# Patient Record
Sex: Male | Born: 1935 | Race: White | Hispanic: No | Smoking: Former smoker
Health system: Southern US, Community
[De-identification: ages and names within clinical notes are randomized; demographics above are authoritative.]

## PROBLEM LIST (undated history)

## (undated) DIAGNOSIS — R011 Cardiac murmur, unspecified: Secondary | ICD-10-CM

## (undated) DIAGNOSIS — I4819 Other persistent atrial fibrillation: Secondary | ICD-10-CM

## (undated) DIAGNOSIS — Z903 Acquired absence of stomach [part of]: Secondary | ICD-10-CM

## (undated) DIAGNOSIS — R0602 Shortness of breath: Secondary | ICD-10-CM

## (undated) DIAGNOSIS — K279 Peptic ulcer, site unspecified, unspecified as acute or chronic, without hemorrhage or perforation: Secondary | ICD-10-CM

## (undated) DIAGNOSIS — I2510 Atherosclerotic heart disease of native coronary artery without angina pectoris: Secondary | ICD-10-CM

## (undated) DIAGNOSIS — Z9889 Other specified postprocedural states: Secondary | ICD-10-CM

## (undated) DIAGNOSIS — Z953 Presence of xenogenic heart valve: Secondary | ICD-10-CM

## (undated) DIAGNOSIS — I340 Nonrheumatic mitral (valve) insufficiency: Secondary | ICD-10-CM

## (undated) DIAGNOSIS — Z8679 Personal history of other diseases of the circulatory system: Secondary | ICD-10-CM

## (undated) DIAGNOSIS — I519 Heart disease, unspecified: Secondary | ICD-10-CM

## (undated) DIAGNOSIS — I509 Heart failure, unspecified: Secondary | ICD-10-CM

## (undated) DIAGNOSIS — I10 Essential (primary) hypertension: Secondary | ICD-10-CM

## (undated) HISTORY — PX: PARTIAL GASTRECTOMY: SHX2172

## (undated) HISTORY — DX: Heart disease, unspecified: I51.9

## (undated) HISTORY — PX: CARDIAC CATHETERIZATION: SHX172

## (undated) HISTORY — DX: Atherosclerotic heart disease of native coronary artery without angina pectoris: I25.10

---

## 2007-08-05 ENCOUNTER — Encounter: Payer: Self-pay | Admitting: Cardiology

## 2007-08-06 ENCOUNTER — Ambulatory Visit: Payer: Self-pay | Admitting: Internal Medicine

## 2007-08-06 ENCOUNTER — Ambulatory Visit: Payer: Self-pay | Admitting: Cardiology

## 2007-08-06 ENCOUNTER — Inpatient Hospital Stay (HOSPITAL_COMMUNITY): Admission: AD | Admit: 2007-08-06 | Discharge: 2007-08-09 | Payer: Self-pay | Admitting: Cardiology

## 2007-08-06 ENCOUNTER — Encounter: Payer: Self-pay | Admitting: Internal Medicine

## 2007-08-07 ENCOUNTER — Encounter: Payer: Self-pay | Admitting: Cardiology

## 2007-08-07 ENCOUNTER — Ambulatory Visit: Payer: Self-pay | Admitting: Thoracic Surgery (Cardiothoracic Vascular Surgery)

## 2007-08-08 ENCOUNTER — Encounter: Payer: Self-pay | Admitting: Cardiology

## 2007-08-15 ENCOUNTER — Encounter: Payer: Self-pay | Admitting: Thoracic Surgery (Cardiothoracic Vascular Surgery)

## 2007-08-15 ENCOUNTER — Inpatient Hospital Stay (HOSPITAL_COMMUNITY)
Admission: RE | Admit: 2007-08-15 | Discharge: 2007-08-23 | Payer: Self-pay | Admitting: Thoracic Surgery (Cardiothoracic Vascular Surgery)

## 2007-08-15 DIAGNOSIS — Z951 Presence of aortocoronary bypass graft: Secondary | ICD-10-CM | POA: Diagnosis present

## 2007-08-15 DIAGNOSIS — Z953 Presence of xenogenic heart valve: Secondary | ICD-10-CM

## 2007-08-15 HISTORY — PX: AORTIC VALVE REPLACEMENT: SHX41

## 2007-08-15 HISTORY — PX: CORONARY ARTERY BYPASS GRAFT: SHX141

## 2007-08-15 HISTORY — DX: Presence of xenogenic heart valve: Z95.3

## 2007-08-27 ENCOUNTER — Ambulatory Visit: Payer: Self-pay | Admitting: Cardiology

## 2007-09-03 ENCOUNTER — Ambulatory Visit: Payer: Self-pay | Admitting: Cardiology

## 2007-09-09 ENCOUNTER — Ambulatory Visit: Payer: Self-pay | Admitting: Thoracic Surgery (Cardiothoracic Vascular Surgery)

## 2007-09-09 ENCOUNTER — Encounter
Admission: RE | Admit: 2007-09-09 | Discharge: 2007-09-09 | Payer: Self-pay | Admitting: Thoracic Surgery (Cardiothoracic Vascular Surgery)

## 2007-09-10 ENCOUNTER — Ambulatory Visit: Payer: Self-pay | Admitting: Cardiology

## 2007-09-12 ENCOUNTER — Ambulatory Visit: Payer: Self-pay | Admitting: Cardiology

## 2007-09-20 ENCOUNTER — Ambulatory Visit: Payer: Self-pay | Admitting: Cardiology

## 2007-09-25 ENCOUNTER — Ambulatory Visit: Payer: Self-pay | Admitting: Cardiology

## 2007-10-03 ENCOUNTER — Ambulatory Visit: Payer: Self-pay | Admitting: Cardiology

## 2007-10-08 ENCOUNTER — Ambulatory Visit: Payer: Self-pay | Admitting: Cardiology

## 2007-10-16 ENCOUNTER — Ambulatory Visit: Payer: Self-pay | Admitting: Cardiology

## 2007-10-24 ENCOUNTER — Ambulatory Visit: Payer: Self-pay | Admitting: Cardiology

## 2007-10-30 ENCOUNTER — Ambulatory Visit: Payer: Self-pay | Admitting: Cardiology

## 2007-11-05 ENCOUNTER — Ambulatory Visit: Payer: Self-pay | Admitting: Cardiology

## 2007-11-09 HISTORY — PX: CARDIOVERSION: SHX1299

## 2007-11-11 ENCOUNTER — Encounter: Payer: Self-pay | Admitting: Cardiology

## 2007-11-12 ENCOUNTER — Ambulatory Visit: Payer: Self-pay | Admitting: Cardiology

## 2007-11-20 ENCOUNTER — Ambulatory Visit: Payer: Self-pay | Admitting: Cardiology

## 2007-12-04 ENCOUNTER — Ambulatory Visit: Payer: Self-pay | Admitting: Cardiology

## 2007-12-18 ENCOUNTER — Encounter: Payer: Self-pay | Admitting: Cardiology

## 2007-12-18 ENCOUNTER — Ambulatory Visit: Payer: Self-pay | Admitting: Cardiology

## 2007-12-20 ENCOUNTER — Ambulatory Visit: Payer: Self-pay | Admitting: Thoracic Surgery (Cardiothoracic Vascular Surgery)

## 2007-12-23 ENCOUNTER — Ambulatory Visit: Payer: Self-pay | Admitting: Cardiology

## 2008-01-24 ENCOUNTER — Ambulatory Visit: Payer: Self-pay | Admitting: Cardiology

## 2008-08-11 ENCOUNTER — Ambulatory Visit: Payer: Self-pay | Admitting: Cardiology

## 2008-09-16 IMAGING — CR DG CHEST 2V
2 series · 2 of 2 positions shown · non-contrast
Comparison: Chest x-ray of 08/20/2007

CLINICAL DATA: CABG 3 weeks ago, follow-up

CHEST - 2 VIEW

[view not recorded (1 of 2)]
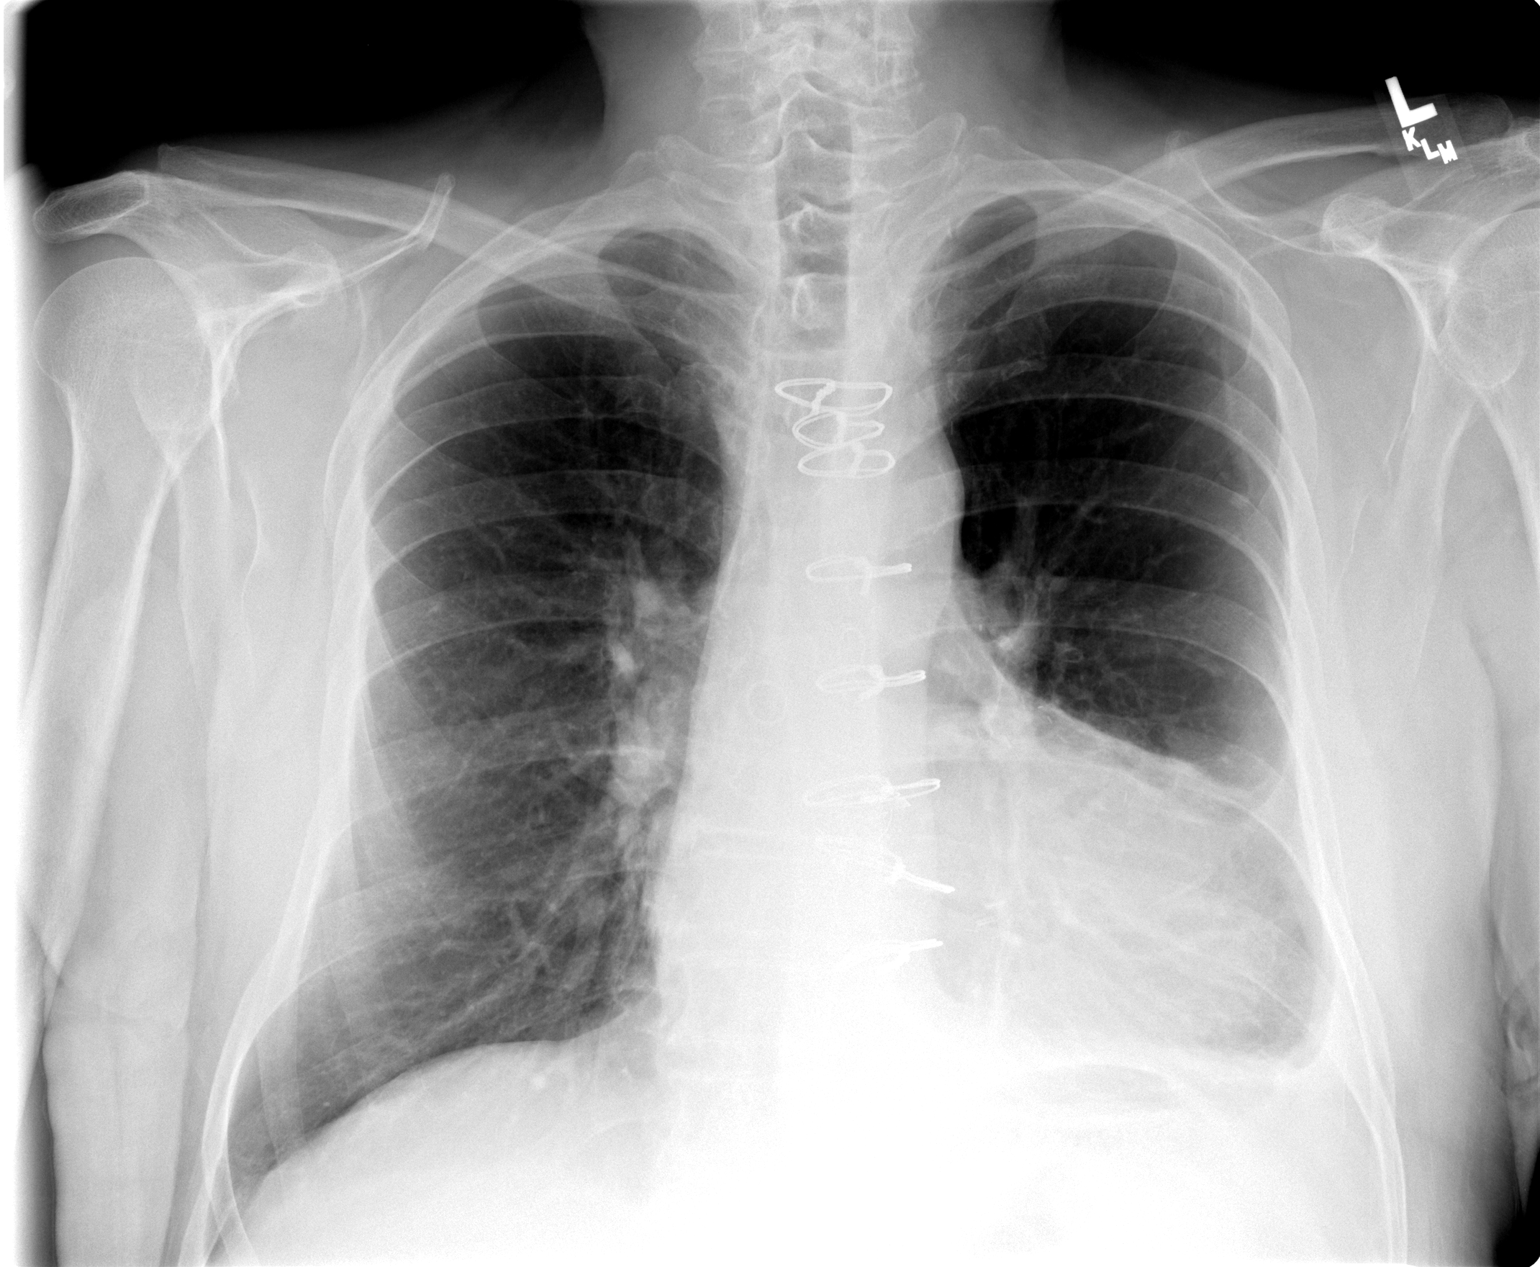

[view not recorded (2 of 2)]
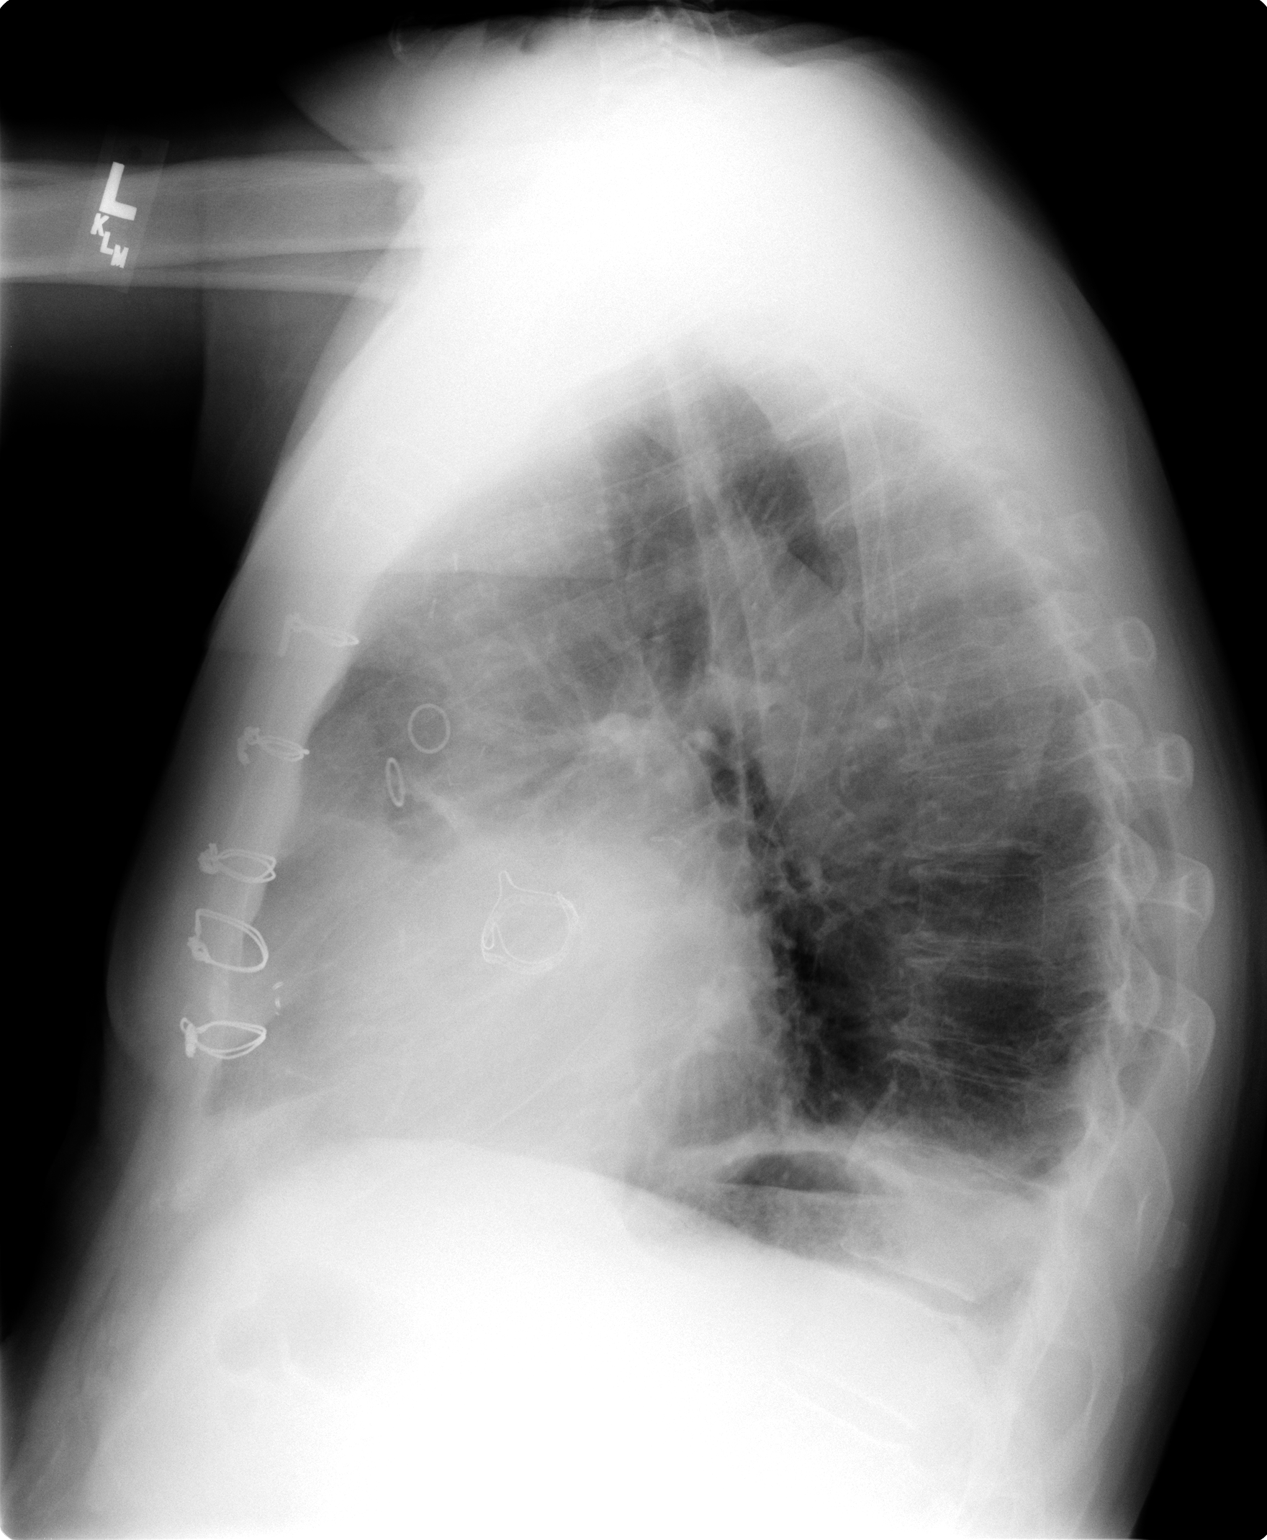

[2 of 2 positions shown; findings below may reference images not displayed]

FINDINGS: Aeration has improved.  Only a small left pleural
effusion remains.  Cardiomegaly is stable.  No pneumothorax is
seen.
IMPRESSION: Improved aeration with only small left pleural effusion remaining.

## 2008-09-23 ENCOUNTER — Ambulatory Visit: Payer: Self-pay | Admitting: Cardiology

## 2009-01-13 DIAGNOSIS — I25810 Atherosclerosis of coronary artery bypass graft(s) without angina pectoris: Secondary | ICD-10-CM | POA: Insufficient documentation

## 2009-02-08 ENCOUNTER — Encounter (INDEPENDENT_AMBULATORY_CARE_PROVIDER_SITE_OTHER): Payer: Self-pay | Admitting: *Deleted

## 2009-03-03 ENCOUNTER — Encounter: Payer: Self-pay | Admitting: Cardiology

## 2009-04-28 ENCOUNTER — Encounter: Payer: Self-pay | Admitting: Cardiology

## 2009-04-28 ENCOUNTER — Encounter (INDEPENDENT_AMBULATORY_CARE_PROVIDER_SITE_OTHER): Payer: Self-pay | Admitting: *Deleted

## 2009-05-21 ENCOUNTER — Ambulatory Visit: Payer: Self-pay | Admitting: Cardiology

## 2009-05-21 DIAGNOSIS — Z954 Presence of other heart-valve replacement: Secondary | ICD-10-CM | POA: Insufficient documentation

## 2009-06-14 ENCOUNTER — Encounter: Payer: Self-pay | Admitting: Cardiology

## 2009-06-14 ENCOUNTER — Encounter (INDEPENDENT_AMBULATORY_CARE_PROVIDER_SITE_OTHER): Payer: Self-pay | Admitting: *Deleted

## 2009-06-14 DIAGNOSIS — E785 Hyperlipidemia, unspecified: Secondary | ICD-10-CM

## 2009-06-25 ENCOUNTER — Encounter: Payer: Self-pay | Admitting: Cardiology

## 2009-07-16 ENCOUNTER — Encounter (INDEPENDENT_AMBULATORY_CARE_PROVIDER_SITE_OTHER): Payer: Self-pay | Admitting: *Deleted

## 2009-07-21 ENCOUNTER — Telehealth (INDEPENDENT_AMBULATORY_CARE_PROVIDER_SITE_OTHER): Payer: Self-pay | Admitting: *Deleted

## 2010-05-10 NOTE — Assessment & Plan Note (Signed)
Summary: 6 MO FU REMINDER-SRS   Visit Type:  Follow-up Primary Provider:  none  CC:  follow-up visit.  History of Present Illness: the patient is a 77 year old malewith a history of coronary bypass grafting x3 and aortic valve replacement. The patient has LV dysfunction with an ejection fraction of 35-40%. He reports no heart failure symptoms. He denies any chest pain, shortness of breath orthopnea PND palpitations or syncope. 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 The patient's lipid panel was abnormal with triglycerides of 164 and an LDL of 128 but the patient was not taking simvastatin at that time.  Clinical Review Panels:  Echocardiogram Echocardiogram Conclusions:         1. The estimated ejection fraction is 35-40%.          2. The  basal inferolateral, basal inferior, mid inferolateral, mid         inferior, apical lateral and apical inferior wall segments are         hypokinetic.         3. Mild concentric left ventricular hypertrophy is observed.         4. The left atrium is mildly dilated.         5. A bovine bio-prosthetic aortic valve is present. 25 mm bovine av         6. The aortic valve area, by VTI's, is calculated at 1.29 cm2.          7. The mean gradient of the aortic valve is 9 mmHg.          8. There is mild mitral regurgitation.          9. There is mild tricuspid regurgitation.         10. The inferior vena cava appears normal.         11. There is a greater than 50% respiratory change in the inferior         vena cava dimension.       (08/11/2008)    Preventive Screening-Counseling & Management  Alcohol-Tobacco     Smoking Status: quit     Year Quit: 1985  Current Problems (verified): 1)  Cad, Artery Bypass Graft  (ICD-414.04) 2)  Left Ventricular Function, Decreased  (ICD-429.2)  Current Medications (verified): 1)  Lisinopril 20 Mg Tabs (Lisinopril) .... Take 1 Tablet By Mouth Once A Day 2)  Nitroglycerin 0.4  Mg Subl (Nitroglycerin) .... Place One Tablet Under Tongue As Needed For Severe Chest Pain, Not To Exceed 3 in 15 Min Time Frame 3)  Carvedilol 3.125 Mg Tabs (Carvedilol) .... Take One Tablet By Mouth Twice A Day 4)  Simvastatin 40 Mg Tabs (Simvastatin) .... Take 1 Tab By Mouth At Bedtime (Place On File) 5)  Aspir-Low 81 Mg Tbec (Aspirin) .... Take 1 Tablet By Mouth Once A Day  Allergies (verified): No Known Drug Allergies  Comments:  Nurse/Medical Assistant: The patient's medications and allergies were reviewed with the patient and were updated in the Medication and Allergy Lists.Bottles reviewed.  Past History:  Past Medical History: Last updated: 01/13/2009 CAD, ARTERY BYPASS GRAFT (ICD-414.04) LEFT VENTRICULAR FUNCTION, DECREASED (ICD-429.2)    Past Surgical History: Last updated: 01/13/2009 CABG Valve Replacement-Aortic (S/P)  Family History: Last updated: 05/21/2009 Negative FH of Diabetes, Hypertension, or Coronary Artery Disease  Social History: Last updated: 01/13/2009 Retired  Married  Tobacco Use - Former.   Risk Factors: Smoking Status: quit (05/21/2009)  Family History:  Negative FH of Diabetes, Hypertension, or Coronary Artery Disease  Social History: Reviewed history from 01/13/2009 and no changes required. Retired  Married  Tobacco Use - Former.   Review of Systems  The patient denies fatigue, malaise, fever, weight gain/loss, vision loss, decreased hearing, hoarseness, chest pain, palpitations, shortness of breath, prolonged cough, wheezing, sleep apnea, coughing up blood, abdominal pain, blood in stool, nausea, vomiting, diarrhea, heartburn, incontinence, blood in urine, muscle weakness, joint pain, leg swelling, rash, skin lesions, headache, fainting, dizziness, depression, anxiety, enlarged lymph nodes, easy bruising or bleeding, and environmental allergies.    Vital Signs:  Patient profile:   77 year old male Height:      71 inches Weight:       201 pounds BMI:     28.14 Pulse rate:   73 / minute BP sitting:   148 / 80  (left arm) Cuff size:   regular  Vitals Entered By: Carlye Grippe (May 21, 2009 8:49 AM)  Nutrition Counseling: Patient's BMI is greater than 25 and therefore counseled on weight management options. CC: follow-up visit   Physical Exam  Additional Exam:  General: Well-developed, well-nourished in no distress head: Normocephalic and atraumatic eyes PERRLA/EOMI intact, conjunctiva and lids normal nose: No deformity or lesions mouth normal dentition, normal posterior pharynx neck: Supple, no JVD.  No masses, thyromegaly or abnormal cervical nodes. Normal upstroke no bruit bruits lungs: Normal breath sounds bilaterally without wheezing.  Normal percussion heart: regular rate and rhythm with normal S1 and S2, no S3 or S4.  PMI is normal.  No pathological murmurs abdomen: Normal bowel sounds, abdomen is soft and nontender without masses, organomegaly or hernias noted.  No hepatosplenomegaly musculoskeletal: Back normal, normal gait muscle strength and tone normal pulsus: Pulse is normal in all 4 extremities Extremities: No peripheral pitting edema neurologic: Alert and oriented x 3 skin: Intact without lesions or rashes cervical nodes: No significant adenopathy psychologic: Normal affect    Impression & Recommendations:  Problem # 1:  CAD, ARTERY BYPASS GRAFT (ICD-414.04) patient reports no chest pain. He is doing well. He will need a stress test in one year. His updated medication list for this problem includes:    Lisinopril 20 Mg Tabs (Lisinopril) .[name]... Take 1 tablet by mouth once a day    Nitroglycerin 0.4 Mg Subl (Nitroglycerin) .[name]... Place one tablet under tongue as needed for severe chest pain, not to exceed 3 in 15 min time frame    Carvedilol 3.125 Mg Tabs (Carvedilol) .[name]... Take one tablet by mouth twice a day    Aspir-low 81 Mg Tbec (Aspirin) .[name]... Take 1 tablet by mouth once a  day  Problem # 2:  LEFT VENTRICULAR FUNCTION, DECREASED (ICD-429.2) ejection fraction 35-40% but the patient is hemodynamically stable and has no heart failure symptoms.  Problem # 3:  AORTIC VALVE REPLACEMENT, HX OF (ICD-V43.3) patient status post aortic valve replacement with a bioprosthesis. There is no significant pathological murmur and exam.  Patient Instructions: 1)  Your physician recommends that you continue on your current medications as directed. Please refer to the Current Medication list given to you today. 2)  Follow up in  1 year.

## 2010-05-10 NOTE — Letter (Signed)
Summary: Engineer, materials at [facility]  [address]   [place], [place] [address]   Phone: [phone]  Fax: [phone]        April 28, 2009 MRN: [id number]   [facility] [address] [place], [place]  [address]   Dear Mr. Andonian,  Your test ordered by Selena Batten has been reviewed by your physician (or physician assistant) and was found to be abnormal.  Your good cholesterol (HDL) was a little low. Your physician (or physician assistant) felt that you need to increase your Simvastatin to 40mg  nightly.  You will need follow up labs in 6 weeks.  Our office will mail you an order when time to have this done.  A new prescription will be sent to your pharmacy and placed on file for when you need it.  You can double up on your 20mg  to make the 40mg , then switch to new one above.    ____ Echocardiogram  ____ Cardiac Stress Test  __X__ Lab Work  ____ Peripheral vascular study of arms, legs or neck  ____ CT scan or X-ray  ____ Lung or Breathing test  __X__ Other:  We did try to reach you numerous times by phone, but was unsuccessful.  Please                       call the office if you have any questions about this information.     Thank you.   Hoover Brunette, LPN    Duane Boston, M.D., F.A.C.C. Thressa Sheller, M.D., F.A.C.C. Oneal Grout, M.D., F.A.C.C. Cheree Ditto, M.D., F.A.C.C. Daiva Nakayama, M.D., F.A.C.C. Kenney Houseman, M.D., F.A.C.C. Jeanne Ivan, PA-C

## 2010-05-10 NOTE — Miscellaneous (Signed)
Summary: rx - simvastatin  Clinical Lists Changes  Medications: Changed medication from SIMVASTATIN 20 MG TABS (SIMVASTATIN) Take one tablet by mouth daily at bedtime to SIMVASTATIN 40 MG TABS (SIMVASTATIN) Take 1 tab by mouth at bedtime (PLACE ON FILE) - Signed Rx of SIMVASTATIN 40 MG TABS (SIMVASTATIN) Take 1 tab by mouth at bedtime (PLACE ON FILE);  #30 x 6;  Signed;  Entered by: Hoover Brunette, LPN;  Authorized by: Lewayne Bunting, MD, [facility];  Method used: Electronically to [facility] [address] *, [address] [place], [place]  [address], Ph: [phone], Fax: [phone]    Prescriptions: SIMVASTATIN 40 MG TABS (SIMVASTATIN) Take 1 tab by mouth at bedtime (PLACE ON FILE)  #30 x 6   Entered by:   Hoover Brunette, LPN   Authorized by:   Lewayne Bunting, MD, [facility]   Signed by:   Hoover Brunette, LPN on 33/82/5053   Method used:   Electronically to        [facility] [address] * (retail)       [address]       [place], [place]  [address]       Ph: [phone]       Fax: [phone]   RxID:   [id number]

## 2010-05-10 NOTE — Miscellaneous (Signed)
Summary: Orders Update - FLP/LFT  Clinical Lists Changes  Problems: Added new problem of HYPERLIPIDEMIA (ICD-272.4) Orders: Added new Test order of T-Lipid Profile [id number]) - Signed Added new Test order of T-Hepatic Function [phone]) - Signed

## 2010-05-10 NOTE — Letter (Signed)
Summary: Generic Engineer, agricultural at [facility] [address]   [place], [place] [address]   Phone: [phone]  Fax: [phone]        June 14, 2009 MRN: [id number]    [facility] [address] [place], [place]  [address]    Dear Mr. Souder,  According to our records, it is now time for your follow up lab work.  Please take the enclosed order to the [facility] at your earliest convenience.   Reminder:  Nothing to eat or drink after 12 midnight prior to labs.         Sincerely,  Hoover Brunette, LPN  This letter has been electronically signed by your physician.

## 2010-05-10 NOTE — Progress Notes (Signed)
Summary: PHONE: RETURNED CALL TO [name]  Phone Note Call from Patient   Caller: Patient Reason for Call: Talk to Nurse Summary of Call: [name] ret'd call from [place]. Please call patient at this # [phone] Initial call taken by: Zachary George,  July 21, 2009 1:28 PM  Follow-up for Phone Call        Pt. came by office.  Results given.  Follow-up by: Hoover Brunette, LPN,  July 27, 2009 11:13 AM

## 2010-05-10 NOTE — Letter (Signed)
Summary: Generic Engineer, agricultural at [facility] [address]   [place], [place] [address]   Phone: [phone]  Fax: [phone]        July 16, 2009 MRN: [id number]    [facility] [address] [place], [place]  [address]    Dear Mr. Riederer,  Please contact our office regarding your most recent lab results.  The number that we have on file for you [phone]) has a message stating that it has been disconnected or changed.           Sincerely,  Hoover Brunette, LPN  This letter has been electronically signed by your physician.

## 2010-07-22 ENCOUNTER — Ambulatory Visit: Payer: Self-pay | Admitting: Cardiology

## 2010-08-23 NOTE — Assessment & Plan Note (Signed)
[facility]                          [facility] NOTE   NAME:[name], [name]                        MRN:          [id number]  DATE:12/18/2007                            DOB:          July 26, 1931    PRIMARY CARDIOLOGIST:  Learta Codding, MD, [facility]   CARDIOTHORACIC SURGEON:  Salvatore Decent. Cornelius Moras, MD   REASON FOR VISIT:  Post-cardioversion followup.   HISTORY OF PRESENT ILLNESS:  I recently saw [name] for an elective  cardioversion of atrial fibrillation that was done back on November 12, 2007 in Dr. Margarita Mail absence.  He is a pleasant gentleman with history  of severe aortic stenosis status post bioprosthetic aortic valve  replacement with a #25 Magna Edwards pericardial tissue valve done at  the time of coronary artery bypass grafting in May 2009 by Dr. Cornelius Moras.  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  He has been  managed by Dr. Andee Lineman and was scheduled for an elective cardioversion  following adequate anticoagulation.  He was successfully converted to  sinus rhythm with a single biphasic shock at 100 joules and has  continued on Coumadin since that time with therapeutic INR levels.  His  electrocardiogram today shows sinus rhythm with an interventricular  conduction delay, which is old and inferior Q-waves consistent with  prior inferior wall infarct.  He reports that he feels good without  chest pain or palpitations.  We discussed stopping Coumadin at this  point and also plan to have followup 2-D echocardiogram to reassess his  left ventricular function.  He states that he is due to see Dr. Cornelius Moras  back later this month as well.   ALLERGIES:  No known drug allergies.   PRESENT MEDICATIONS:  1. Coumadin as directed by the [facility].  2. Lasix 40 mg p.o. daily.  3. Lisinopril 10 mg p.o. daily.  4. Pantoprazole 40 mg p.o. daily.  5. Klor-Con 20 mEq  p.o. daily.  6. Aspirin 81 mg p.o. daily.  7. Coreg 6.25 mg p.o. b.i.d.  8. Zocor 40 mg p.o. nightly.   REVIEW OF SYSTEMS:  As described in the history of present illness.  Otherwise negative.   PHYSICAL EXAMINATION:  VITAL SIGNS:  Blood pressure is 116/70, heart  rate is 78 and regular, and weight is 193 pounds.  GENERAL:  The patient is comfortable and in no acute distress.  HEENT:  Conjunctiva is normal.  Pharynx is clear.  NECK:  Supple.  No elevated jugular venous pressure.  No loud bruits or  thyromegaly is noted.  LUNGS:  Clear with diminished breath sounds.  No labored breathing.  CARDIAC:  Regular rate and rhythm.  Soft systolic murmur at the base,  preserved second heart sound.  No pericardial rub.  ABDOMEN:  Soft and nontender.  No active bowel sounds.  EXTREMITIES:  Exhibit no significant pitting edema.  Distal pulses are  2+.  SKIN:  Warm and dry.  MUSCULOSKELETAL:  No kyphosis noted.  NEUROPSYCHIATRIC:  The patient alert and oriented x3.  Affect is  appropriate   IMPRESSION AND RECOMMENDATIONS:  1. History of postoperative atrial fibrillation status post successful      cardioversion back on November 12, 2007.  [name] has been      therapeutic with subsequent Coumadin levels since then and will now      be taken off of Coumadin as he maintains sinus rhythm.  2. History of cardiomyopathy with an ejection fraction of 25-30%      associated with coronary artery disease and aortic stenosis.  He is      on a good medical regimen at this point and is hemodynamically      stable.  I will arrange a followup 2-D echocardiogram to reassess      left ventricular function now that he has been back in sinus rhythm      and schedule a followup visit with Dr. Andee Lineman over the next month.  3. Severe aortic valve stenosis status post bioprosthetic aortic valve      replacement in May 2009.     Jonelle Sidle, MD  Electronically Signed    SGM/MedQ  DD: 12/18/2007  DT:  12/19/2007  Job #: [id number]   cc:   Learta Codding, MD,FACC  Salvatore Decent Cornelius Moras, M.D.

## 2010-08-23 NOTE — Discharge Summary (Signed)
NAMEBRODIN, GELPI               ACCOUNT NO.:  [id number]   MEDICAL RECORD NO.:  [id number]           PATIENT TYPE:   LOCATION:                                 FACILITY:   PHYSICIAN:  Salvatore Decent. Cornelius Moras, M.D. DATE OF BIRTH:  Mar 02, 1932   DATE OF ADMISSION:  DATE OF DISCHARGE:                               DISCHARGE SUMMARY   FINAL DIAGNOSES:  1. Severe aortic insufficiency.  2. Three-vessel coronary artery disease.   IN-HOSPITAL DIAGNOSES:  1. Postoperative atrial fibrillation, persistent.  2. Acute renal insufficiency postoperatively.  3. Acute blood loss anemia postoperatively.  4. Volume overload postoperatively.   SECONDARY DIAGNOSES:  1. Hypertension.  2. Peptic ulcer disease.  3. Remote tobacco abuse.  4. Dyslipidemia.  5. Status post antrectomy and some type of drainage procedure for      severe peptic ulcer disease approximately 35 years ago.   IN-HOSPITAL OPERATIONS AND PROCEDURES:  1. Aortic valve replacement using a 25-mm Edwards Magna pericardial      tissue valve.  2. Coronary artery bypass grafting x3, using the left internal mammary      artery to distal left anterior descending coronary artery,      saphenous vein graft to circumflex marginal branch, and saphenous      vein graft to left posterior descending coronary artery.      Endoscopic saphenous vein harvest from right thigh was done.   HISTORY AND PHYSICAL AND HOSPITAL COURSE:  The patient is a 77 year old  male with no previous cardiac history with known history of  hypertension.  The patient presents with progressive symptoms of  exertional shortness of breath that progressed rapidly over several  weeks, who has had development of class 4 congestive heart failure with  pulmonary edema.  The patient was hospitalized and improved rapidly with  medical treatment.  He was noted to have a murmur on physical exam and 2-  D echocardiogram was done.  This demonstrated moderate-to-severe  insufficiency  and moderate-to-severe left ventricular dysfunction.  The  patient subsequently underwent cardiac catheterization, which was  notable for a presence of severe three-vessel coronary artery disease.  Following these studies, the patient was seen and evaluated by Dr. Cornelius Moras.  Dr. Cornelius Moras discussed the patient undergoing aortic valve replacement as  well as coronary artery bypass grafting.  He discussed the risks and  benefits with the patient.  The patient nods understanding and agreed to  proceed.  Surgery was scheduled for Aug 15, 2007.  For details of the  patient's past medical history and physical exam, please see dictated  H&P.   The patient was taken to the operating room, where he underwent aortic  valve replacement using 25-mm Edwards Magna pericardial tissue valve.  He also had coronary artery bypass grafting x3 done using a left  internal mammary artery to distal left anterior descending coronary  artery, saphenous vein graft to circumflex marginal branch, and  saphenous vein graft to left posterior descending coronary artery.  Endoscopic saphenous vein harvest from right thigh was done.  Prior to  undergoing surgery, the patient was intubated without any  bag-mask  ventilation, which followed an episode of emesis.  Flexible fiberoptic  bronchoscopy was performed to rule out significant aspiration.  The  distal trachea, carina, and left and right endobronchial trees were  carefully visualized.  There were no signs of any significant bile-  stained fluid within the endotracheal tree.  It was felt that there were  no endobronchial abnormalities, and the patient was cleared for surgery.  The patient tolerated the above-mentioned procedure well.  He was  transferred to the intensive care unit in stable condition.  Postoperatively, the patient was noted to be hemodynamically stable.  He  was extubated the evening of surgery.  Post extubation, the patient was  noted to be alert and oriented  x4, neuro intact.  Chest x-ray done on  postop day #1 was clear.  He had minimal drainage from the chest tube,  and the chest tube was discontinued in a normal fashion.  The patient  was encouraged to use his incentive spirometer.  He was able to be  weaned off oxygen sating greater than 90% on room air.  Chest x-ray  prior to discharge remained stable with no significant effusions or  atelectasis.  Postoperatively, the patient was noted to be in normal  sinus rhythm.  Blood pressure and heart rate were stable.  On the  evening of postop day #2, the patient went into atrial fibrillation with  controlled ventricular rate.  The patient had no history of atrial  fibrillation.  He was started on IV amiodarone.  The patient continued  in rate-controlled AFib.  He was also started on Coreg and lisinopril.  Blood pressure remained stable.  As the patient was rate controlled, IV  amiodarone was discontinued, and he was started on p.o. amiodarone.  Unfortunately, the patient was not able to convert to normal sinus  rhythm, and he remained in rate-controlled atrial fibrillation.  He was  started on Coumadin on postop day #4.  Daily PT/INRs were obtained, and  Coumadin was adjusted appropriately.  By postop day #5, the patient was  complaining of significant nausea.  It was thought that this was  secondary to the amiodarone.  Due to the patient being in rate-  controlled AFib with complications of nausea, amiodarone was  discontinued.  He was continued on Coreg.  Following discontinuation of  amiodarone, nausea did improve.  The patient continued to receive  Coumadin nightly.  Currently, INR was 1.3 on postop day #7.  Planned  therapeutic goal is 2.0-3.0.  The patient currently remains in rate-  controlled atrial fibrillation.  External pacing wires were discontinued  without difficulty.  Postoperatively, the patient had volume overload  requiring Lasix drip.  Lasix drip did help.  He was switched to  p.o.  diuretic b.i.d.  The patient's volume overload was improving, but  unfortunately his creatinine was elevating.  By postop day #7, the  patient's creatinine was up to 1.52.  Lasix p.o. dose was decreased.  The patient is currently below his preoperative weight.  I will plan to  recheck BMP prior to discharge home.  The patient did have mild acute  blood loss anemia postoperatively.  He was asymptomatic and did not  require any transfusions.  Last hemoglobin and hematocrit on postop day  #5 was 9.1 and 26.2.  Postoperatively, the patient is out of bed and  ambulating well with cardiac rehab.  Following discontinuation of  amiodarone, he is tolerating diet well.  No nausea or vomiting.  By  postop day #7, the patient was noted to be afebrile.  He was in rate-  controlled atrial fibrillation with heart rate in the 70s.  Blood  pressure stable.  O2 sat greater than 90% on room air.   LABORATORY DATA:  An INR, which showed 1.3.  Sodium of 136, potassium  3.7, chloride of 92, bicarb 36, BUN of 28, creatinine 1.52, and glucose  of 112.  Last CBC done on postop day #5 showed a white count 10.5,  hemoglobin 9.1, hematocrit 26.2, and platelet count 134.   The patient's pulmonary status was stable.  Incisions were clean, dry,  and intact and healing well.  The patient is tentatively ready for  discharge home in the a.m. pending his INR continues to increase towards  therapeutic and his creatinine remains stable.   FOLLOWUP APPOINTMENTS:  A followup appointment has been arranged with  Dr. Cornelius Moras for September 09, 2007 at 1:30 p.m.  The patient will need to obtain  PA and lateral chest x-ray 30 minutes prior to this appointment.  The  patient will need to follow up with Dr. Andee Lineman in 2 weeks.  He will need  to contact his office to make these arrangements.  The patient will also  need to follow up with [facility] 2 days post discharge for  PT/INR  and blood work.   ACTIVITY:  The  patient is instructed no driving, until he agrees to do  so, and no heavy lifting over 10 pounds.  He is told to ambulate 3-4  times per day, progress as tolerated, and to continue his breathing  exercises.   INCISIONAL CARE:  The patient is told to shower, washing his incisions  using soap and water.  He is to contact the office if he develops any  drainage or opening from any of his incision sites.   DIET:  The patient is to begin on a diet to be low fat, low salt.   DISCHARGE MEDICATIONS:  1. Zocor 40 mg at night.  2. Coreg 6.25 mg twice daily.  3. Lasix 40 mg daily.  4. Potassium 20 mEq daily.  5. Pantoprazole 40 mg daily.  6. Lisinopril 10 mg daily.  7. Coumadin will be dosed on the patient's discharge PT/INR level.  8. Aspirin 81 mg daily.  9. Ultram 50 mg 1-2 tablets q.4-6 hours p.r.n.      Theda Belfast, PA      Salvatore Decent. Cornelius Moras, M.D.  Electronically Signed    KMD/MEDQ  D:  08/22/2007  T:  08/23/2007  Job:  [id number]   cc:   Salvatore Decent. Cornelius Moras, M.D.  Learta Codding, MD,FACC

## 2010-08-23 NOTE — Op Note (Signed)
[name], [name]               ACCOUNT NO.:  [id number]   MEDICAL RECORD NO.:  [id number]          PATIENT TYPE:  INP   LOCATION:  2312                         FACILITY:  [facility]   PHYSICIAN:  Salvatore Decent. Cornelius Moras, M.D. DATE OF BIRTH:  1931/08/26   DATE OF PROCEDURE:  08/15/2007  DATE OF DISCHARGE:                               OPERATIVE REPORT   PREOPERATIVE DIAGNOSIS:  Severe aortic insufficiency and three-vessel  coronary artery disease.   POSTOPERATIVE DIAGNOSIS:  Severe aortic insufficiency and three-vessel  coronary artery disease.   PROCEDURES:  1. Flexible bronchoscopy.  2. Median sternotomy for aortic valve replacement (25-mm Edwards Magna      pericardial tissue valve) and coronary artery bypass grafting x3      (left internal mammary artery to distal left anterior descending      coronary artery, saphenous vein graft to circumflex marginal      branch, saphenous vein graft to left posterior descending coronary      artery, endoscopic saphenous vein harvest from right thigh).   SURGEON:  Salvatore Decent. Cornelius Moras, MD   ASSISTANT:  Stephanie Acre. Dominick, PA   ANESTHESIA:  General.   BRIEF CLINICAL NOTE:  The patient is a 77 year old male with no previous  cardiac history with known history of hypertension.  The patient  presents with progressive symptoms of exertional shortness of breath  that progressed rapidly over several weeks culminating in the  development of class IV congestive heart failure with pulmonary edema.  The patient was hospitalized and improved rapidly with medical  treatment.  He was noted to have a murmur on physical exam, and 2-D  echocardiogram demonstrates moderate to severe aortic insufficiency and  moderate to severe left ventricular dysfunction.  The patient  subsequently underwent cardiac catheterization, which was notable for  the presence of severe three-vessel coronary artery disease.  A full  consultation note had been dictated previously.  The  patient and his  family have been counseled at length regarding the indications, risks,  and potential benefits of surgery.  Alternative treatment strategies  have been discussed.  They understand and accept all associated risks of  surgery and desired to proceed as described.  The relative risks and  benefits of use of a bioprosthetic tissue valve have been reviewed as  well, and the patient understands the small, but nonetheless,  significant chance for late structural valve deterioration and failure  depending upon the patient's longevity.  All of the questions have been  addressed.   OPERATIVE FINDINGS:  1. Functionally bicuspid aortic valve with mild aortic stenosis and      severe aortic insufficiency.  2. Dilated left ventricle (end-diastolic dimension greater than 7 cm)      with severe left ventricular dysfunction (ejection fraction 25%).  3. Inferior wall scar consistent with remote transmural myocardial      infarction.  4. Good quality left internal mammary artery and saphenous vein      conduit for grafting.  5. Good quality target vessels for grafting.  6. Episode of emesis upon induction of anesthesia with flexible  fiberoptic bronchoscopy revealing no evidense of significant      aspiration.   OPERATIVE NOTE IN DETAIL:  The patient was brought to the operating room  on the above-mentioned date, and central monitoring was established by  the Anesthesia Service under the care and direction of Dr. Jairo Ben.  Specifically, a Swan-Ganz catheter was placed through the  right internal jugular approach.  A radial arterial line was placed.  Intravenous antibiotics were administered.  General endotracheal  anesthesia was induced.  Upon induction, the patient was noted to vomit  a small amount of green tinged, but otherwise clear fluid.  The patient  was rapidly intubated without any bag mask ventilation following the  episode of emesis.  Flexible fiberoptic  bronchoscopy was subsequently  performed to rule out significant aspiration.  The distal trachea,  carina, and left and right endobronchial trees were carefully  visualized.  There was no sign of any significant bile-stained fluid  within the endotracheal tree.  The airways were subsequently flushed  with copious warm saline solution.  No endobronchial abnormalities were  appreciated.  The bronchoscope was removed uneventfully, and a decision  was made to proceed with surgery as planned.   A Foley catheter was placed.  The patient's chest, abdomen, both groins,  and both lower extremities were prepared and draped in sterile manner.  Baseline transesophageal echocardiogram was performed by Dr. Jean Rosenthal.  This demonstrates functionally bicuspid aortic valve with mild aortic  stenosis and severe aortic insufficiency.  The left ventricle was  dilated with end-diastolic dimension greater than 7 cm.  There was  severe left ventricular dysfunction with an ejection fraction estimated  25%.  The inferior wall was thin and akinetic.  There was trivial mitral  regurgitation.  No other abnormalities were noted.   Median sternotomy incision was performed and the left internal mammary  artery was dissected from the chest wall and prepared for bypass  grafting.  The left internal mammary artery was good quality conduit.  Simultaneously, saphenous vein was obtained from the patient's right  thigh.  Using endoscopic vein harvest technique, the saphenous vein was  good quality conduit.  After the saphenous vein had been removed, the  small incision in the right lower extremity was closed in multiple  layers with running absorbable suture.  The patient was heparinized  systemically and the left internal mammary artery was transected  distally.  It was noted to have excellent flow.   The pericardium was opened.  The ascending aorta was mildly dilated, but  measures less than or equal to 3 cm in its  greatest transverse diameter.  The aorta was, otherwise, normal in appearance.  The ascending aorta and  the right atrium were cannulated for cardiopulmonary bypass.  A  retrograde cardioplegic catheter was placed through the right atrium  into the coronary sinus.  Adequate heparinization was verified.  Cardiopulmonary bypass was begun.  A left ventricular vent was placed  through the right superior pulmonary vein.  The surface of the heart was  inspected.  There were adhesions between the visceral and parietal  surface of the pericardium posteriorly.  These were divided with sharp  dissection.  There was scarring in the inferior wall consistent of  remote history of a transmural myocardial infarction.  The inferior wall  was thin.  Distal target vessels were selected for coronary bypass  grafting.  A temperature probe was placed in left ventricular septum and  a cardioplegic catheter was placed in the  ascending aorta.   The patient was cooled to 28 degrees systemic temperature.  The aortic  crossclamp was applied and cold blood cardioplegia was administered  initially in an antegrade fashion through the aortic root.  Retrograde  cardioplegia was subsequently utilized for the majority of the arresting  dose of cardioplegia due to the presence of severe aortic insufficiency.  Iced saline slush was applied for topical hypothermia.  The initial  cardioplegic arrest and myocardial cooling was felt to be satisfactory.  Repeat doses of cardioplegia were administered intermittently throughout  the crossclamp portion of the operation through the aortic root,  antegrade down subsequently placed vein grafts, and retrograde through  the coronary sinus catheter to maintain left ventricular septal  temperature below 15 degrees centigrade and to maintain the  electrocardiogram isoelectric.   The following distal coronary anastomoses were performed:  1. The circumflex marginal branch was grafted with  a saphenous vein      graft in end-to-side fashion.  This vessel measured 2.0 mm in      diameter and was a good quality target vessel for grafting.  2. The posterior descending coronary artery which was terminal branch      of the left circumflex coronary artery was grafted with a saphenous      vein graft in end-to-side fashion.  This vessel measured 1.5 mm in      diameter and was a fair to good quality target vessel for grafting  3. The distal left anterior descending coronary artery was grafted      with left internal mammary artery in end-to-side fashion.  This      vessel measured 2.0 mm in diameter and was a good quality target      vessel for grafting.   An oblique aortotomy incision was performed.  The aortic valve was  inspected.  The aortic valve was functionally bicuspid with a fused  raphe between the left and right sinuses of Valsalva.  There was  calcification within the valve and mild aortic stenosis.  The  noncoronary cusp of the aortic valve was enlarged and prolapse was  severely.  The aortic valve was excised sharply.  The aortic annulus was  decalcified.  This was technically straightforward.  The aortic root and  left ventricular subsequently irrigated with copious iced saline  solution.  The aortic annulus was sized to accept a 25-mm stented  bioprosthetic tissue valve.   Aortic valve replacement was performed using interrupted 2-0 horizontal  mattress pledgeted sutures with pledgets in the subannular position.  A  25-mm Edwards Magna pericardial tissue valve (model number 3000, serial  number [id number]) was secured in place uneventfully.  The valve seated  easily and without difficulty.  Rewarming was begun.   The aortotomy was closed using a two-layer closure of running 4-0  Prolene suture.  Both proximal saphenous vein anastomoses were performed  directly to the ascending aorta prior to removal of the aortic  crossclamp.  The left ventricular septal  temperature rises rapidly with  reperfusion of the left internal mammary artery.  One final dose of warm  retrograde hot-shot cardioplegia was administered.  The lungs were  ventilated, and the heart failed to evacuate any residual air through  the aortic root.  The aortic crossclamp was removed after a total  crossclamp time of 135 minutes.   The heart began to beat spontaneously without need for cardioversion.  All proximal and distal coronary anastomoses were inspected for  hemostasis and appropriate graft  orientation.  The aortotomy was  inspected for hemostasis.  Epicardial pacing wires fixed the left  ventricular free wall into the right atrial appendage.  The patient was  rewarmed to 37 degrees centigrade temperature.  Low-dose milrinone and  dopamine infusions were begun.  The patient was subsequently weaned from  cardiopulmonary bypass without difficulty.  A Magoon needle was placed  in the aortic root to serve as vent prior to separation from bypass.  The patient's rhythm at separation from bypass was sinus rhythm with  first-degree AV block.  AV sequential pacing was employed.  Total  cardiopulmonary bypass time for the operation was 118 minutes.  The  patient was weaned from bypass on low-dose milrinone and dopamine.   Followup transesophageal echocardiogram performed by Dr. Jean Rosenthal after  separation from bypass demonstrates some improvement in the left  ventricular function.  There was well-seated bioprosthetic tissue valve  in the aortic position.  There was no aortic insufficiency.  Initially,  there were few air bubbles, but these dissipate rapidly and the aortic  root vent was removed.  The left ventricular vent was removed.   The venous and arterial cannulae were removed uneventfully.  Protamine  was administered to reverse anticoagulation.  The mediastinum and left  chest were irrigated with saline solution containing vancomycin.  Meticulous surgical hemostasis was  ascertained.  There was moderate  coagulopathy.  The patient was transfused 2 packs of adult platelets and  2 units of fresh frozen plasma for coagulopathy.  The mediastinum and  the left pleural space were subsequently drained with 3 chest tubes  exited through separate stab incisions inferiorly.  The pericardium and  soft tissues anterior to the aorta were reapproximated loosely.  The  sternum was closed with double-strength sternal wire.  The soft tissues  anterior to the sternum were closed in multiple layers and the skin was  closed with running subcuticular skin closure.   The patient tolerated the procedure well and was transported to the  surgical intensive care unit in stable condition.  There were no  intraoperative complications.  All sponge, instrument and needle counts  verified correct at completion of the operation.      Salvatore Decent. Cornelius Moras, M.D.  Electronically Signed     CHO/MEDQ  D:  08/15/2007  T:  08/16/2007  Job:  [id number]   cc:   Bevelyn Buckles. Bensimhon, MD  Learta Codding, MD,FACC  Mardene Sayer, MD

## 2010-08-23 NOTE — Assessment & Plan Note (Signed)
OFFICE VISIT   [name], [name]  DOB:  07-10-1931                                        September 09, 2007  CHART #:  [id number]   HISTORY OF PRESENT ILLNESS:  The patient is status post aortic valve  replacement using a 25-mm Edwards Magna pericardial tissue valve as well  as coronary artery bypass grafting x3, done Aug 15, 2007, by Dr. Cornelius Moras.  Postoperatively, the patient did have issues with his rhythm.  He went  into atrial fibrillation with controlled ventricular rate.  The patient  was started on amiodarone but unfortunately was unable to convert.  Eventually, the patient was started on Coumadin due to persistent rate-  controlled atrial fibrillation.  Otherwise, the patient's postoperative  course was pretty much unremarkable.  He presents today for his 3-week  followup visit.  The patient is without complaints.  He states he is  still taking the Ultram at night to help him sleep.  He denies any  significant chest pain, shortness of breath, nausea, vomiting, or  opening or drainage from any of his incision sites.  He has an  appointment to follow up with Dr. Andee Lineman in the a.m.  They have been  managing the patient's Coumadin and has been checked twice since  discharge.  His PT/INR level in the last time was INR was 4.0.  Dr.  Andee Lineman is managing his Coumadin.  The patient is ambulating 3-4 times  per day without difficulty.  He is tolerating diet well.  The patient  has stopped on his own, the Coreg and Zocor.   PHYSICAL EXAMINATION:  Vital signs:  Blood pressure 130/66, pulses 58,  respirations 18, and O2 sat 90% on room air.  Respiratory:  Clear to  auscultation bilaterally.  Cardiac:  The patient is noted to have an  irregular regular rhythm, pulses in the 50s.  Abdomen:  Benign.  Extremities:  No edema, cyanosis, or clubbing noted.  Incisions clean,  dry, intact, and healing well.   STUDIES:  The patient had a PA and lateral chest x-ray done today,  which  shows a small left pleural effusion.   IMPRESSION AND PLAN:  The patient is status post coronary artery bypass  grafting/aortic valve replacement.  The patient is progressing well.  He  was seen and evaluated by Dr. Cornelius Moras.  Dr. Cornelius Moras discussed with the patient  to keeping his followup appointment with Dr. Andee Lineman in the a.m.  Dr.  Andee Lineman will continue to manage his Coumadin dosing, and delayed  cardioversion could be considered if he remains in atrial fibrillation.  The patient was strongly encouraged to do cardiac rehab.  He is to  continue ambulating on his own 3-4 times per day.  The patient is  instructed, he is allowed to drive.  He was told no heavy lifting over  10 pounds for the next month and then gradually increase.  We will plan  to see the patient back in 3 months for reevaluation.  He was told, if  he has any questions regarding any surgical issues, he is to contact us.   Salvatore Decent. Cornelius Moras, M.D.  Electronically Signed   KMD/MEDQ  D:  09/09/2007  T:  09/10/2007  Job:  [id number]   cc:   Salvatore Decent. Cornelius Moras, M.D.

## 2010-08-23 NOTE — Assessment & Plan Note (Signed)
[facility]                          [facility] NOTE   NAME:[name]                      MRN:          [id number]  DATE:09/23/2008                            DOB:          July 10, 1931    REFERRING PHYSICIAN:  Linward Foster   HISTORY OF PRESENT ILLNESS:  The patient is a 77 year old male with a  history of coronary artery disease and status post aortic valve  bioprosthesis replacement.  The patient has known LV dysfunction,  ejection fraction is 30-35%.  He had a recent echo which showed his EF  slightly improved to 35-40%.  Unfortunately, the patient ran out of his  medication as well as not clear that he needs to be continued.  He was  only on an ACE inhibitor when the echocardiogram was obtained.  He  denies of having chest pain, shortness of breath, orthopnea, or PND.  He  is in NYHA class I.   MEDICATIONS:  1. Lisinopril 20 mg p.o. daily.  2. Aspirin 81 mg p.o. daily.  3. Occasional Tylenol.   PHYSICAL EXAMINATION:  VITAL SIGNS:  Blood pressure is 131/78, heart  rate 68, weight 200 pounds.  GENERAL:  A well-nourished white male in no apparent distress.  HEENT:  Pupils are isocoric.  Conjunctivae are clear.  NECK:  Supple.  Normal carotid upstroke and no carotid bruits.  LUNGS:  Clear breath sounds bilaterally.  HEART:  Regular rate and rhythm.  Normal S1 and S2.  No murmur, rubs, or  gallops.  ABDOMEN:  Soft, nontender.  No rebound or guarding.  Good bowel sounds.  EXTREMITIES:  No cyanosis, clubbing or edema.  NEURO:  The patient is alert, oriented, and grossly nonfocal.   PROBLEM LIST:  1. Status post aortic valve replacement bioprosthesis.  2. Coronary artery bypass grafting x3.  3. Left ventricular dysfunction improved with ejection fraction to 35-      40%.  4. The patient is not on beta-blocker as prescription ran out.   PLAN:  1. The patient needs to be placed on optimal heart failure regimen and      we  will restart Coreg 3.125 mg p.o. b.i.d., this can be further      uptitrated.  2. The patient also need to be on statin drug therapy and we will      check in 6 weeks starting lipid panel and liver function test.  3. The patient is to continue aspirin 81 mg p.o. daily.  He will also      continue lisinopril 20 mg p.o. daily.  4. At this point in time, it appears to be no indication particularly      in light that the patient is on an optimal medical regimen for a      defibrillator.     Learta Codding, MD,FACC  Electronically Signed    GED/MedQ  DD: 09/23/2008  DT: 09/23/2008  Job #: [id number]   cc:   Linward Foster

## 2010-08-23 NOTE — Assessment & Plan Note (Signed)
[facility]                          [facility]ICE NOTE   NAME:Weiss, Harry                        MRN:          [id number]  DATE:11/05/2007                            DOB:          Aug 26, 1931    PRIMARY CARDIOLOGIST:  Dr. Vernie Shanks. DeGent.   [name] is a 77 year old gentleman with a history of severe aortic  valve stenosis status post bioprosthetic aortic valve replacement with a  #25 Magna Edwards pericardial tissue valve at the same time of coronary  artery bypass grafting back in May 2009.  He has a left ventricular  ejection fraction of 25-30%, as well as postoperative atrial  fibrillation that has persisted.  He has been managed with Coumadin and  was seen by Dr. Andee Lineman, most recently in June 2009.  Plans were made for  an elective cardioversion attempt, and he has been followed through our  Coumadin clinic with INR levels in the therapeutic range or higher over  the last 4 weeks.  He is being scheduled for an elective cardioversion  on August 4th.   ALLERGIES:  NO KNOWN DRUG ALLERGIES.   MEDICATIONS:  1. Coumadin as directed by the Coumadin Clinic.  2. Lasix 40 mg p.o. daily.  3. Lisinopril 10 mg p.o. daily.  4. Pantoprazole 40 mg p.o. daily.  5. Klor-Con 20 mg p.o. daily.  6. Aspirin 81 mg p.o. daily.   PAST MEDICAL HISTORY, SOCIAL HISTORY AND FAMILY HISTORY:  Have all been  reviewed previously.  The initial cardiology consultation note by Dr.  Andee Lineman in April was reviewed.  There has been no other major interval  change, except as outlined above.   [name] is scheduled to present for an elective outpatient  cardioversion through the day hospital on November 12, 2007.  He will have  a review of systems and examination update at that time, to be  subsequently documented.   IMPRESSION/RECOMMENDATIONS:  1. Persistent postoperative atrial fibrillation, managed with a      strategy of heart rate control and anticoagulation.   The patient is      scheduled on November 12, 2007, through the day hospital to undergo      an elective direct cardioversion in an attempt to restore normal      sinus rhythm.  He will be assessed that morning with informed      consent obtained and also a repeat PT/INR level to ensure that he      is therapeutic on Coumadin.  2. History of aortic stenosis status post pericardial tissue valve      replacement in May 2009.      The patient has left ventricular dysfunction in the range of 25-      30%, and is also status post      coronary artery bypass grafting.  Medical therapy is anticipated at      this point with a follow-up echocardiogram once he is back in sinus      rhythm.     Jonelle Sidle, MD  Electronically Signed    SGM/MedQ  DD: 11/11/2007  DT: 11/11/2007  Job #: [id number]

## 2010-08-23 NOTE — Discharge Summary (Signed)
NAMEALAN, DRUMMER               ACCOUNT NO.:  [id number]   MEDICAL RECORD NO.:  [id number]          PATIENT TYPE:  INP   LOCATION:  3740                         FACILITY:  [facility]   PHYSICIAN:  Theodore Demark, PA-C   DATE OF BIRTH:  Mar 17, 1932   DATE OF ADMISSION:  08/06/2007  DATE OF DISCHARGE:  08/09/2007                               DISCHARGE SUMMARY   PROCEDURES:  1. Cardiac catheterization.  2. Right heart catheterization.  3. Coronary arteriogram.  4. Transesophageal echocardiogram  5. CT of the chest without contrast.   PRIMARY FINAL DISCHARGE DIAGNOSIS:  Three-vessel coronary artery  disease.   SECONDARY DIAGNOSES:  1. Acute systolic congestive heart failure.  2. Severe aortic insufficiency.  3. Hypertension.  4. Remote history of gastric surgery secondary to obstruction of      bleeding gastric ulcer.  5. Dyslipidemia with a total cholesterol of 177, triglycerides 87, HDL      37, LDL 123 this admission.  6. Ischemic cardiomyopathy with an ejection fraction of 25-30% by      transesophageal echocardiography this admission.  7. Chronic kidney disease stage III with a BUN of 25, creatinine of      1.49, and a GFR of 46 at discharge.  8. Groin hematoma without evidence for pseudoaneurysm or AV fistula.      Time of discharge, 43 minutes.   HOSPITAL COURSE:  Mr. Bordner is a 77 year old male with no previous  history of coronary artery disease.  He went to [facility]  because of shortness of breath and was evaluated by cardiology.  Because  of his symptoms as well as his left ventricular dysfunction on  echocardiogram and physical exam, there was concern for both aortic  valve problems and coronary artery disease, so he was transferred to  [facility] for further evaluation.   The TEE showed an EF of 25-30% with a fused right and left coronary cusp  of the aortic valve such that it is functionally bicuspid.  His EF was  25-30%.  There was mild atheroma of the  descending aorta and moderate to  severe aortic regurgitation.  The cardiac catheterization showed severe  native three-vessel disease with an 80% LAD, 70% diagonal, 80%-90%  circumflex, and 99% RCA.  A surgical consult was called.  He was seen by  Dr. Donata Clay, who recommended bypass surgery with an aortic valve  replacement.  He had received Plavix at the time of his initial  presentation at [facility] and the surgery was scheduled for Aug 15, 2007.  It was felt that this could be elective and could be done as an  outpatient.   On Aug 09, 2007, Mr. Caniglia was ambulating without chest pain or  shortness of breath.  He had a beta blocker added to his medication  regimen and was tolerating this well.  He had a statin added to his  medication regimen as well diuretics and potassium supplement.  He was  started on Imdur and p.r.n. nitroglycerin.  Digoxin was added as well.  Mr. Seckman was considered stable for discharge on Aug 09, 2007,  and will  return for surgery in a week.   DISCHARGE INSTRUCTIONS:  His activity level is to be increased  gradually.  He is to call our office for any problems with the cath  site.  He is to follow up with Dr. Andee Lineman at the [facility] office  after the surgery.  He is encouraged to obtain a primary care physician.  He is to stick to a low-sodium heart-healthy diet.   DISCHARGE MEDICATIONS:  1. Zocor 40 mg a day.  2. Aspirin 325 mg a day.  3. Coreg 6.25 mg b.i.d.  4. Lasix 40 mg a day.  5. Potassium 20 mEq a day.  6. Digoxin 0.125 mg daily.  7. Sublingual nitroglycerin p.r.n.  8. Imdur 30 mg a day.  9. He is to avoid Goody's powder.      Theodore Demark, PA-C     RB/MEDQ  D:  08/09/2007  T:  08/10/2007  Job:  [id number]   cc:   [facility] [place]

## 2010-08-23 NOTE — Assessment & Plan Note (Signed)
[facility]                          [facility] NOTE   NAME:[name], [name]                        MRN:          [id number]  DATE:09/10/2007                            DOB:          03-Dec-1931    HISTORY OF PRESENT ILLNESS:  The patient is a 76 year old male with a  history of severe aortic valvular heart disease with aortic  insufficiency and bicuspid aortic valve.  The patient also has severe  coronary artery disease.  He underwent aortic valve replacement with a  25-mm Edwards Magna pericardial tissue valve.  He also underwent  coronary bypass grafting x3.  The patient was discharged on Aug 23, 2007.  The patient had postoperative atrial fibrillation as well as  blood loss and renal insufficiency.  He has significantly improved.  He  is less short of breath.  He denies any chest pain, palpitations or  syncope.  He remains in atrial fibrillation with a heart rate of 90  beats per minute.  The patient is significantly confused about his  medications and we adjusted this and reintroduced his discharge  medication.   MEDICATIONS:  1. Zocor 40 mg p.o. nightly. mA  2. Coreg 6.25 mg p.o. b.i.d.  3. Lasix 40 mg p.o. daily.  4. Potassium 20 mEq p.o. daily.  5. Pantoprazole 40 mg p.o. daily.  6. Lisinopril 10 mg p.o. daily.  7. Coumadin as directed.  8. Aspirin 81 mg a day.  9. Ultram p.r.n. chest pain.   PHYSICAL EXAMINATION:  VITAL SIGNS:  Blood pressure 102/66, heart rate  95, weight is 186 pounds.  NECK:  Normal carotid stroke, no carotid bruits.  LUNGS:  Clear breath sounds bilaterally.  HEART:  Regular rate and rhythm, normal S1, S2.  No murmurs, rubs or  gallops.  ABDOMEN:  Soft and nontender.  No rebound or guarding.  Good bowel  sounds.  EXTREMITIES:  No cyanosis, clubbing or edema.   PROBLEM LIST:  1. Status post aortic valve replacement with pericardial tissue valve      secondary to severe aortic insufficiency.  2. Left  ventricular dysfunction, ejection fraction 25-30%.  3. Status post coronary artery bypass grafting, see details above.  4. Postoperative atrial fibrillation, rate-controlled.   PLAN:  1. The patient will continue on Coumadin anticoagulation at least in      the short term for his atrial fibrillation.  In 4 weeks we will      consider cardioversion.  2. The patient likely can be discontinued off Coumadin once he had      restored normal sinus rhythm and he has been anticoagulated for at      least 4 weeks after cardioversion.  3. The patient does have significant left ventricular dysfunction.  In      approximately 6 weeks or at least several weeks after his      cardioversion, he needs reassessment of his LV function.  4. The patient was seen recently by Dr. Cornelius Moras and appears to be doing      postoperatively very well.  5. We will check a PT/INR  today and schedule the patient electively      for cardioversion.     Learta Codding, MD,FACC  Electronically Signed    GED/MedQ  DD: 09/10/2007  DT: 09/10/2007  Job #: [id number]

## 2010-08-23 NOTE — Cardiovascular Report (Signed)
[name], HUSTEAD               ACCOUNT NO.:  [id number]   MEDICAL RECORD NO.:  [id number]          PATIENT TYPE:  INP   LOCATION:  2807                         FACILITY:  [facility]   PHYSICIAN:  Bevelyn Buckles. Bensimhon, MDDATE OF BIRTH:  March 03, 1932   DATE OF PROCEDURE:  DATE OF DISCHARGE:                            CARDIAC CATHETERIZATION   PATIENT IDENTIFICATION:  [name] is a 77 year old male with a  history of hypertension, but no known history of heart disease.  He has  a history of chronic dyspnea which got much worse over the past week and  a half.  He presented to [facility].  Echocardiogram showed  ejection fraction of approximately 30% with significant aortic  insufficiency.  He was sent for right and left heart catheterization.   PROCEDURES PERFORMED:  1. Right heart cath.  2. Selective coronary angiography.  3. Left heart cath.  4. Left subclavian angiography.  5. StarClose femoral artery closure.   Ventriculogram was not performed in order to limit contrast exposure.   DESCRIPTION OF PROCEDURE:  Risks and indication of the catheterization  were explained.  Consent was signed and placed on the chart.  A 5-French  arterial sheath was placed in the right femoral artery and standard  catheters including JL-4, JR-4 and angled pigtail used for procedure.  All catheter exchanges made over wire.  No apparent complications.  A 7-  French venous sheath was placed in the right femoral vein and standard  Swan-Ganz catheter was used for the procedure with no apparent  complications.   Total contrast used was 80 mL of Omnipaque.   Right atrial pressure mean of 5, RV pressure 29/3.  PA pressure 32/15  with a mean of 21.  Pulmonary capillary wedge pressure mean of 11.  Central aortic pressure 127/66 with a mean of 91.  LV pressure of 146/28  with EDP of 35.  Fick cardiac output was 4.6 liters per minute.  Cardiac  index was 2.2 liters per minute per meter squared.   Left  main had a 30% stenosis.   LAD was a large vessel coursing to the apex.  It gave off a large first  diagonal.  In the proximal LAD, there was an 80% focal lesion at the  takeoff of the first diagonal.  In the mid LAD, there was 60-70% lesion.  Distal LAD had a 30% lesion.  In the proximal portion of the diagonal  there was 70% tubular lesion.   Left circumflex was a large dominant vessel.  It gave off a small OM-1  and small OM-2, a large OM 3, three posterolaterals a small PDA.  In the  mid left circ, there was 80% focal lesion.  Followed by 60% tubular  plaquing and then a very high-grade 99% lesion with possible thrombus.  In the distal AV groove circ, there is a 70% focal lesion with a 90%  lesion at the very distal portion between the last posterolateral in the  PDA.  In the ostium of the small second marginal, there was 70% focal  lesion.  There is also a 70% tubular  lesion in the proximal portion of  the large OM 3.  There was mild diffuse disease throughout the  posterolateral system.   Right coronary artery was a small nondominant vessel which was  subtotally occluded in the midsection.   ASSESSMENT:  1. Severe coronary artery disease as described above.  2. Moderate-to-severe aortic insufficiency by TEE.  3. Moderate to severely depressed LV function by TEE.   PLAN:  Plan will be for CTS made a cardiothoracic surgery consult by Dr.  Cornelius Moras for aVR and CABG.  We will also diurese him.      Bevelyn Buckles. Bensimhon, MD  Electronically Signed     DRB/MEDQ  D:  08/06/2007  T:  08/07/2007  Job:  [id number]

## 2010-08-23 NOTE — Assessment & Plan Note (Signed)
OFFICE VISIT   [name]  DOB:  07/20/1931                                        December 20, 2007  CHART #:  [id number]   HISTORY OF PRESENT ILLNESS:  The patient returns for followup now  approximately 4 months status post aortic valve replacement and coronary  artery bypass grafting x3.  He was last seen here in the office on September 09, 2007.  Since then, he has continued to improve.  He has been followed  by the [facility] Team in [place], and he was recently taken off of  Coumadin.  He has been maintaining sinus rhythm.  Overall, he is getting  along well.  He states that he feels much better than he did prior to  surgery.  He notes that his stamina is still not back to where he would  like it to be, but his exercise tolerance is greatly improved from where  it was prior to surgery.  He completes 6 weeks of the cardiac  rehabilitation program.  His appetite is good.  He is sleeping well.  He  has mild sensitivity along the left anterior chest wall consistent with  previous left internal mammary artery harvest and grafting.  He  otherwise has no residual pain.  The remainder of his review of systems  is unremarkable.   CURRENT MEDICATIONS:  Zocor 40 mg daily, Coreg 6.25 mg twice daily,  Lasix 40 mg daily, potassium chloride 20 mEq daily, Protonix 40 mg  daily, lisinopril 10 mg daily, and aspirin 81 mg daily.   PHYSICAL EXAMINATION:  GENERAL:  Notable for a well-appearing male.  VITAL SIGNS:  Blood pressure 148/92.  Of note, the patient ran out of  his blood pressure medication 2 days ago and has yet to stop by the  pharmacy to pick up the prescription that is awaiting for him.  His  pulse is 66 and regular and oxygen saturation 96% on room air.  HEENT:  Unrevealing.  CHEST:  A well-healed median sternotomy scar.  The sternum is stable on  palpation.  Auscultation of the chest demonstrates clear breath sounds,  which are symmetrical.  No  wheezes or rhonchi are noted.  CARDIOVASCULAR:  Notable for regular rate and rhythm.  This is systolic  murmur that is soft along the right sternal border.  ABDOMEN:  Soft and nontender.  EXTREMITIES:  Warm and well perfused.  There is no lower extremity  edema.   IMPRESSION:  The patient appears to be doing well, now 4 months status  post aortic valve replacement and coronary artery bypass grafting.   PLAN:  Apparently, the patient is scheduled for a followup 2D  echocardiogram to be done at the [facility].  We will  look for the results of this exam.  Overall, he seems to be doing well.  In the future, he will call and return to see [place] here at [facility] only should further problems or difficulties  arise.  At this point, he is free to resume normal physical activity  without limitation.   [name], M.D.  Electronically Signed   CHO/MEDQ  D:  12/20/2007  T:  12/20/2007  Job:  [id number]   cc:   Learta Codding, MD,FACC  Rock Nephew. Bensimhon, MD  Jonelle Sidle, MD

## 2010-08-23 NOTE — Assessment & Plan Note (Signed)
[facility]                          [facility] OFFICE NOTE   NAME:[name], [name]                        MRN:          [id number]  DATE:01/24/2008                            DOB:          Apr 22, 1931    PRIMARY CARE PHYSICIAN:  Linward Foster, MD   HISTORY OF PRESENT ILLNESS:  The patient is a very pleasant 77 year old  male with a history of aortic stenoses and coronary artery disease.  The  patient is 5 months status post aortic valve replacement and coronary  artery bypass grafting x3.  The patient also had postoperative  fibrillation but has maintained normal sinus rhythm.  He is feeling  remarkably well.  He has no chest pain, shortness of breath, orthopnea,  or PND.  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  He is  compliant with his medical regimen and has no significant cardiovascular  symptoms.   MEDICATIONS:  1. Furosemide 40 mg a day.  2. Lisinopril 10 mg a day.  3. Pantoprazole 40 mg a day.  4. Klor-Con 20 mg a day.  5. Coreg 6.25 p.o. b.i.d.  6. Zocor 40 mg p.o. nightly.   PHYSICAL EXAMINATION:  VITAL SIGNS:  Blood pressure 124/76, heart rate  71, and weight 196 pounds.  NECK:  Normal carotid upstroke and no carotid bruits.  LUNGS:  Clear breath sounds bilaterally.  HEART:  Regular rate and rhythm.  Normal S1 and S2.  No murmur, rubs, or  gallops.  ABDOMEN:  Soft and nontender.  There is no rebound or guarding.  Good  bowel sounds.  EXTREMITIES:  No cyanosis, clubbing, or edema.   PROBLEMS:  1. Status post aortic valve replacement with bioprosthesis.  2. Coronary artery bypass grafting x3.  3. Left ventricular dysfunction, ejection fraction improved to 30-35%      over 5 months.   PLAN:  1. The patient is doing remarkably well.  His ejection fraction      improved from 20-25 to 30-35.  I will optimize his medical regimen      with lisinopril 20 mg a today.  2. The patient then will follow up with [place] in 6  months at which time      we will repeat an      echocardiographic study.  I do not think that the patient at this      point in time is filling criteria for defibrillator therapy yet and      will wait until the next echo.     Learta Codding, MD,FACC  Electronically Signed    GED/MedQ  DD: 01/24/2008  DT: 01/25/2008  Job #: [id number]   cc:   Linward Foster

## 2010-08-23 NOTE — Consult Note (Signed)
NAMEBURNETT, SPRAY               ACCOUNT NO.:  [id number]   MEDICAL RECORD NO.:  [id number]          PATIENT TYPE:  INP   LOCATION:  3740                         FACILITY:  [facility]   PHYSICIAN:  Salvatore Decent. Cornelius Moras, M.D. DATE OF BIRTH:  04/16/1931   DATE OF CONSULTATION:  08/07/2007  DATE OF DISCHARGE:                                 CONSULTATION   REQUESTING PHYSICIAN:  Bevelyn Buckles. Bensimhon, MD   REASON FOR CONSULTATION:  Severe aortic insufficiency and three-vessel  coronary artery disease with class IV congestive heart failure.   HISTORY OF PRESENT ILLNESS:  Mr. Tory is a 77 year old gentleman with  no previous cardiac history, who has not been seen by a physician  regularly for several years.  The patient has a previous history of  hypertension, although he quit taking all medications more than 1 year  ago.  The patient states that for the last 1-2 years, he has noted some  symptoms of exertional shortness of breath.  This has progressed over  the last several months, and over the last 3 weeks, he has had worsening  problems with intermittent episodes of severe exertional shortness of  breath as well as resting shortness of breath and PND.  This past  weekend, the patient had severe resting shortness of breath at night  that lasted all night prompting him to return from the trip he had [place] the [place].  His symptoms persisted over the next several days,  ultimately prompting him to present to the emergency room at [facility] in [place].  There a chest x-ray was performed  demonstrating evidence of congestive heart failure.  Baseline B-  natriuretic peptide level was 1671.  Electrocardiogram revealed left  ventricular hypertrophy without acute ischemic change.  The patient was  loaded with Plavix.  Cardiology consultation was requested.  A murmur  was noted on physical exam and the echocardiogram was notably consistent  with moderate to severe aortic  insufficiency.  The patient was treated  for congestive heart failure medically and his symptoms improved.  He  was transferred to [facility].  Echocardiogram  demonstrates at least moderate to severe aortic insufficiency with what  appears to be a functionally bicuspid aortic valve.  There is left  ventricular chamber enlargement with moderate to severe left ventricular  dysfunction, ejection fraction estimated 25-30%.  The patient underwent  left and right heart catheterization by Dr. Nicholes Mango.  The patient  was found to have severe three-vessel coronary artery disease.  Pulmonary artery pressures were mildly elevated with PA pressure  measured 32/15, pulmonary capillary wedge pressure of 11.  Baseline  cardiac output was 4.6 L/min, corresponding to a cardiac index of 2.2.  Cardiac surgical consultation was requested.   REVIEW OF SYSTEMS:  GENERAL:  The patient reports exertional fatigue  that has been slowly progressive.  His appetite has been stable and he  has not been gaining or losing weight.  CARDIAC:  Notable for symptoms as described previously.  The patient  denies any history of chest pain, chest tightness or chest pressure.  The patient reports occasional tachy palpitations.  The patient has had  numerous dizzy spells for the last several years.  The patient denies  any history of syncopal episode.  The patient reports no lower extremity  edema.  RESPIRATORY:  Notable for persistent intermittent cough occasionally  productive of thin whitish sputum.  The patient denies hemoptysis or  purulent sputum production.  GASTROINTESTINAL:  Negative.  The patient has no difficulty swallowing.  He reports normal bowel function.  MUSCULOSKELETAL:  Negative.  The patient denies significant arthritis or  arthralgias.  NEUROLOGIC:  Negative.  The patient denies symptoms suggestive of  previous TIA or stroke.  GENITOURINARY:  Negative.  INFECTIOUS:  Negative.   HEENT:  Negative.  The patient is edentulous with full set upper and  lower dentures   PAST MEDICAL HISTORY:  1. Hypertension.  2. Peptic ulcer disease.  3. Remote tobacco use.   PAST SURGICAL HISTORY:  Antrectomy with some type of drainage procedure  for severe peptic ulcer disease approximately 35 years ago.   FAMILY HISTORY:  Noncontributory.   SOCIAL HISTORY:  The patient is married and lives with his wife and son  in [place].  He is retired Visual merchandiser, but remains active physically and  continues to work Scientist, product/process development.  The patient has remote  history of tobacco use, although he quit smoking more than 30 years ago.  The patient denies excessive alcohol consumption.   MEDICATIONS PRIOR TO ADMISSION:  None.   DRUG ALLERGIES:  None known.   PHYSICAL EXAMINATION:  GENERAL APPEARANCE:  The patient is a thin white  male who appears of his stated age in no acute distress.  VITAL SIGNS:  He is currently in normal sinus rhythm.  Blood pressure  114/62, pulse 64.  He has been afebrile.  HEENT:  Unrevealing.  The neck is supple.  There is no cervical nor  supraclavicular lymphadenopathy.  There is no jugular venous distention.  Auscultation of the chest reveals clear breath sounds which are  symmetrical bilaterally.  No wheezes or rhonchi noted.  CARDIOVASCULAR:  Regular rate and rhythm.  There is a classical [employer] early diastolic murmur heard best along the sternal border.  ABDOMEN:  Soft, mildly obese, and nontender.  There is no ascites.  The  liver edge is not palpable.  Bowel sounds are present.  EXTREMITIES:  Warm and well perfused.  There is no lower extremity edema.  Distal  pulses are palpable in the posterior tibial position.  There is no sign  of significant venous insufficiency, although there are mild  varicosities in the lower legs.  SKIN:  Clean, dry, and healthy appearing throughout.  RECTAL AND GU:  Both deferred.  NEUROLOGIC:  Grossly nonfocal and  symmetrical throughout.   LABORATORY DATA:  Baseline hemoglobin 13.1, hematocrit 39%, white blood  count 7000, platelet count 179,000.  Sodium 136, potassium 3.8, chloride  99, bicarb 27, BUN 20, creatinine 1.5, glucose 102.   2-D echocardiogram performed on August 06, 2007, is reviewed.  This  demonstrates dilated left ventricle with depressed left ventricular  function.  There are no segmental wall motion abnormalities.  Ejection  fraction is estimated perhaps 25-30%.  There is functionally bicuspid  aortic valve with eccentric severe aortic insufficiency.  There is mild  enlargement of the aortic root.  There is trivial mitral regurgitation.   Cardiac catheterization performed on August 06, 2007, is reviewed.  This  demonstrates a severe three-vessel coronary artery disease.  Specifically,  there is 50% proximal and 70% stenosis of the mid left  anterior descending coronary artery.  There is 80% proximal stenosis of  the left circumflex coronary artery, and 70% proximal stenosis of a  large first circumflex marginal branch.  There is 90% stenosis of the  distal left circumflex coronary artery with left dominant or codominant  coronary circulation.  There is 100% occlusion of the nondominant right  coronary artery.   IMPRESSION:  Functionally bicuspid native aortic valve with severe  aortic insufficiency, dilated left ventricle with moderate to severe  left ventricular dysfunction and severe three-vessel coronary artery  disease with progressive symptoms of congestive heart failure,  functional class IV at the time of presentation.  Mr. Zerkle was  stabilized medically.  He was started with Plavix at the time of his  initial presentation.  I believe he would best be treated with elective  aortic valve replacement and coronary artery bypass grafting.   PLAN:  I have discussed matters at length with Mr. Zarcone, his son and  daughter here this morning.  Alternative treatment strategies  have been  reviewed.  They understand and accept all associated risks of surgery  including but not limited to risk of death, stroke, myocardial  infarction, congestive heart failure, respiratory failure, pneumonia,  bleeding requiring blood transfusion, arrhythmia, heart block or  bradycardia requiring permanent pacemaker, late complications related to  valve replacement or late recurrence of coronary artery disease.  All  their questions have been addressed.  We tentatively plan to proceed  with surgery on Thursday, Aug 15, 2007.  The relative risks and benefits  of the use of a bioprosthetic tissue valve with potential for late  structural valve deterioration and failure have been discussed.  The  alternative of use of a mechanical prosthesis was attended and need for  long-term anticoagulation has been reviewed.  Mr. Karner specifically  desires that a bioprosthetic tissue valve be utilized at the time of  surgery.      Salvatore Decent. Cornelius Moras, M.D.  Electronically Signed     CHO/MEDQ  D:  08/07/2007  T:  08/07/2007  Job:  [id number]   cc:   Learta Codding, MD,FACC  Bevelyn Buckles. Bensimhon, MD  Mardene Sayer

## 2011-01-03 LAB — POCT I-STAT 3, VENOUS BLOOD GAS (G3P V)
Acid-Base Excess: 2
Acid-Base Excess: 2
Bicarbonate: 27.9 — ABNORMAL HIGH
O2 Saturation: 61
Operator id: 256671
TCO2: 29
pH, Ven: 7.367 — ABNORMAL HIGH
pO2, Ven: 33

## 2011-01-03 LAB — URINALYSIS, ROUTINE W REFLEX MICROSCOPIC
Glucose, UA: NEGATIVE
Protein, ur: NEGATIVE
pH: 5.5

## 2011-01-03 LAB — CBC
HCT: 38.4 — ABNORMAL LOW
Hemoglobin: 13.1
Hemoglobin: 13.4
MCHC: 33.6
MCHC: 34.8
RBC: 4.14 — ABNORMAL LOW
RBC: 4.14 — ABNORMAL LOW
RBC: 4.18 — ABNORMAL LOW
RDW: 13.7
WBC: 7
WBC: 9

## 2011-01-03 LAB — BASIC METABOLIC PANEL
Calcium: 8.8
Calcium: 8.9
Creatinine, Ser: 1.52 — ABNORMAL HIGH
Creatinine, Ser: 1.53 — ABNORMAL HIGH
GFR calc Af Amer: 54 — ABNORMAL LOW
GFR calc Af Amer: 55 — ABNORMAL LOW
GFR calc non Af Amer: 45 — ABNORMAL LOW
GFR calc non Af Amer: 45 — ABNORMAL LOW
Sodium: 136

## 2011-01-03 LAB — HEMOGLOBIN A1C
Hgb A1c MFr Bld: 5.6
Mean Plasma Glucose: 122

## 2011-01-03 LAB — LIPID PANEL
Cholesterol: 177
LDL Cholesterol: 123 — ABNORMAL HIGH

## 2011-01-03 LAB — BLOOD GAS, ARTERIAL
Drawn by: 275531
FIO2: 0.21
TCO2: 27.1
pCO2 arterial: 42.8
pH, Arterial: 7.397

## 2011-01-03 LAB — POCT I-STAT 3, ART BLOOD GAS (G3+)
Acid-Base Excess: 1
O2 Saturation: 92
TCO2: 27
pCO2 arterial: 40.8
pO2, Arterial: 63 — ABNORMAL LOW

## 2011-01-03 LAB — COMPREHENSIVE METABOLIC PANEL
AST: 25
Albumin: 4.1
Chloride: 98
Creatinine, Ser: 1.63 — ABNORMAL HIGH
GFR calc Af Amer: 50 — ABNORMAL LOW
Total Bilirubin: 0.9

## 2011-01-03 LAB — HEPARIN LEVEL (UNFRACTIONATED): Heparin Unfractionated: 0.64

## 2011-01-03 LAB — TYPE AND SCREEN

## 2011-01-03 LAB — ABO/RH: ABO/RH(D): O POS

## 2011-01-04 LAB — BASIC METABOLIC PANEL
BUN: 23
CO2: 34 — ABNORMAL HIGH
Calcium: 9
Chloride: 93 — ABNORMAL LOW
Creatinine, Ser: 1.55 — ABNORMAL HIGH
GFR calc Af Amer: 53 — ABNORMAL LOW
GFR calc non Af Amer: 44 — ABNORMAL LOW
Glucose, Bld: 108 — ABNORMAL HIGH
Potassium: 3.9
Sodium: 138

## 2011-01-04 LAB — PROTIME-INR
INR: 1.6 — ABNORMAL HIGH
Prothrombin Time: 19.6 — ABNORMAL HIGH

## 2011-08-16 DIAGNOSIS — I509 Heart failure, unspecified: Secondary | ICD-10-CM

## 2011-08-17 ENCOUNTER — Other Ambulatory Visit: Payer: Self-pay | Admitting: Physician Assistant

## 2011-08-17 ENCOUNTER — Encounter (HOSPITAL_COMMUNITY): Payer: Self-pay | Admitting: *Deleted

## 2011-08-17 ENCOUNTER — Inpatient Hospital Stay (HOSPITAL_COMMUNITY)
Admission: AD | Admit: 2011-08-17 | Discharge: 2011-09-04 | DRG: 216 | Disposition: A | Discharge status: Home Health | Payer: Medicare Other | Source: Other Acute Inpatient Hospital | Attending: Thoracic Surgery (Cardiothoracic Vascular Surgery) | Admitting: Thoracic Surgery (Cardiothoracic Vascular Surgery)

## 2011-08-17 ENCOUNTER — Other Ambulatory Visit: Payer: Self-pay | Admitting: Cardiology

## 2011-08-17 ENCOUNTER — Other Ambulatory Visit: Payer: Self-pay | Admitting: Thoracic Surgery (Cardiothoracic Vascular Surgery)

## 2011-08-17 DIAGNOSIS — Z8679 Personal history of other diseases of the circulatory system: Secondary | ICD-10-CM

## 2011-08-17 DIAGNOSIS — J9819 Other pulmonary collapse: Secondary | ICD-10-CM | POA: Diagnosis not present

## 2011-08-17 DIAGNOSIS — I255 Ischemic cardiomyopathy: Secondary | ICD-10-CM | POA: Diagnosis present

## 2011-08-17 DIAGNOSIS — I5023 Acute on chronic systolic (congestive) heart failure: Secondary | ICD-10-CM

## 2011-08-17 DIAGNOSIS — I2510 Atherosclerotic heart disease of native coronary artery without angina pectoris: Secondary | ICD-10-CM

## 2011-08-17 DIAGNOSIS — Z951 Presence of aortocoronary bypass graft: Secondary | ICD-10-CM

## 2011-08-17 DIAGNOSIS — Z953 Presence of xenogenic heart valve: Secondary | ICD-10-CM | POA: Diagnosis present

## 2011-08-17 DIAGNOSIS — I340 Nonrheumatic mitral (valve) insufficiency: Secondary | ICD-10-CM

## 2011-08-17 DIAGNOSIS — I059 Rheumatic mitral valve disease, unspecified: Secondary | ICD-10-CM

## 2011-08-17 DIAGNOSIS — N189 Chronic kidney disease, unspecified: Secondary | ICD-10-CM | POA: Diagnosis present

## 2011-08-17 DIAGNOSIS — R5381 Other malaise: Secondary | ICD-10-CM | POA: Diagnosis not present

## 2011-08-17 DIAGNOSIS — I2589 Other forms of chronic ischemic heart disease: Secondary | ICD-10-CM | POA: Diagnosis present

## 2011-08-17 DIAGNOSIS — I4891 Unspecified atrial fibrillation: Secondary | ICD-10-CM | POA: Diagnosis present

## 2011-08-17 DIAGNOSIS — D696 Thrombocytopenia, unspecified: Secondary | ICD-10-CM | POA: Diagnosis not present

## 2011-08-17 DIAGNOSIS — I480 Paroxysmal atrial fibrillation: Secondary | ICD-10-CM

## 2011-08-17 DIAGNOSIS — I509 Heart failure, unspecified: Secondary | ICD-10-CM | POA: Diagnosis present

## 2011-08-17 DIAGNOSIS — Z952 Presence of prosthetic heart valve: Secondary | ICD-10-CM

## 2011-08-17 DIAGNOSIS — E785 Hyperlipidemia, unspecified: Secondary | ICD-10-CM | POA: Diagnosis present

## 2011-08-17 DIAGNOSIS — E876 Hypokalemia: Secondary | ICD-10-CM | POA: Diagnosis not present

## 2011-08-17 DIAGNOSIS — Z9889 Other specified postprocedural states: Secondary | ICD-10-CM

## 2011-08-17 DIAGNOSIS — Z79899 Other long term (current) drug therapy: Secondary | ICD-10-CM

## 2011-08-17 DIAGNOSIS — Z7982 Long term (current) use of aspirin: Secondary | ICD-10-CM

## 2011-08-17 DIAGNOSIS — D62 Acute posthemorrhagic anemia: Secondary | ICD-10-CM | POA: Diagnosis not present

## 2011-08-17 DIAGNOSIS — N183 Chronic kidney disease, stage 3 unspecified: Secondary | ICD-10-CM | POA: Diagnosis present

## 2011-08-17 DIAGNOSIS — I4819 Other persistent atrial fibrillation: Secondary | ICD-10-CM

## 2011-08-17 DIAGNOSIS — I2789 Other specified pulmonary heart diseases: Secondary | ICD-10-CM | POA: Diagnosis present

## 2011-08-17 DIAGNOSIS — I129 Hypertensive chronic kidney disease with stage 1 through stage 4 chronic kidney disease, or unspecified chronic kidney disease: Secondary | ICD-10-CM | POA: Diagnosis present

## 2011-08-17 HISTORY — DX: Cardiac murmur, unspecified: R01.1

## 2011-08-17 HISTORY — DX: Essential (primary) hypertension: I10

## 2011-08-17 HISTORY — DX: Nonrheumatic mitral (valve) insufficiency: I34.0

## 2011-08-17 HISTORY — DX: Acquired absence of stomach (part of): Z90.3

## 2011-08-17 HISTORY — DX: Peptic ulcer, site unspecified, unspecified as acute or chronic, without hemorrhage or perforation: K27.9

## 2011-08-17 HISTORY — DX: Personal history of other diseases of the circulatory system: Z86.79

## 2011-08-17 HISTORY — DX: Shortness of breath: R06.02

## 2011-08-17 HISTORY — DX: Atherosclerotic heart disease of native coronary artery without angina pectoris: I25.10

## 2011-08-17 HISTORY — DX: Other persistent atrial fibrillation: I48.19

## 2011-08-17 HISTORY — DX: Other specified postprocedural states: Z98.890

## 2011-08-17 HISTORY — DX: Presence of xenogenic heart valve: Z95.3

## 2011-08-17 HISTORY — DX: Heart failure, unspecified: I50.9

## 2011-08-17 LAB — CBC
MCH: 31.5 pg (ref 26.0–34.0)
MCHC: 35.1 g/dL (ref 30.0–36.0)
MCV: 89.6 fL (ref 78.0–100.0)
Platelets: 160 10*3/uL (ref 150–400)
RDW: 13.4 % (ref 11.5–15.5)

## 2011-08-17 MED ORDER — SODIUM CHLORIDE 0.9 % IV SOLN
INTRAVENOUS | Status: DC
  Administered 2011-08-18: 05:00:00 via INTRAVENOUS

## 2011-08-17 MED ORDER — ACETAMINOPHEN 325 MG PO TABS
650.0000 mg | ORAL_TABLET | ORAL | Status: DC | PRN
  Administered 2011-08-23: 650 mg via ORAL
  Filled 2011-08-17: qty 2

## 2011-08-17 MED ORDER — ONDANSETRON HCL 4 MG/2ML IJ SOLN
4.0000 mg | Freq: Four times a day (QID) | INTRAMUSCULAR | Status: DC | PRN
  Administered 2011-08-20: 4 mg via INTRAVENOUS
  Filled 2011-08-17: qty 2

## 2011-08-17 MED ORDER — ASPIRIN EC 81 MG PO TBEC
81.0000 mg | DELAYED_RELEASE_TABLET | Freq: Every day | ORAL | Status: DC
  Administered 2011-08-18 – 2011-08-24 (×6): 81 mg via ORAL
  Filled 2011-08-17 (×7): qty 1

## 2011-08-17 MED ORDER — SODIUM CHLORIDE 0.9 % IJ SOLN
3.0000 mL | INTRAMUSCULAR | Status: DC | PRN
  Administered 2011-08-17: 3 mL via INTRAVENOUS

## 2011-08-17 MED ORDER — ASPIRIN 81 MG PO CHEW
324.0000 mg | CHEWABLE_TABLET | ORAL | Status: AC
  Administered 2011-08-18: 324 mg via ORAL
  Filled 2011-08-17: qty 4

## 2011-08-17 MED ORDER — SODIUM CHLORIDE 0.9 % IV SOLN: 250.0000 mL | INTRAVENOUS | Status: DC | PRN

## 2011-08-17 MED ORDER — ASPIRIN 300 MG RE SUPP: 300.0000 mg | RECTAL | Status: DC

## 2011-08-17 MED ORDER — SODIUM CHLORIDE 0.9 % IJ SOLN: 3.0000 mL | Freq: Two times a day (BID) | INTRAMUSCULAR | Status: DC

## 2011-08-17 MED ORDER — ASPIRIN 81 MG PO CHEW
324.0000 mg | CHEWABLE_TABLET | ORAL | Status: AC
  Administered 2011-08-17: 324 mg via ORAL
  Filled 2011-08-17: qty 4

## 2011-08-17 MED ORDER — DIAZEPAM 5 MG PO TABS: 5.0000 mg | ORAL_TABLET | ORAL | Status: DC

## 2011-08-17 MED ORDER — CARVEDILOL 3.125 MG PO TABS
3.1250 mg | ORAL_TABLET | Freq: Two times a day (BID) | ORAL | Status: DC
  Administered 2011-08-17 – 2011-08-21 (×8): 3.125 mg via ORAL
  Filled 2011-08-17 (×10): qty 1

## 2011-08-17 MED ORDER — ATORVASTATIN CALCIUM 80 MG PO TABS
80.0000 mg | ORAL_TABLET | Freq: Every day | ORAL | Status: DC
  Administered 2011-08-17 – 2011-08-24 (×8): 80 mg via ORAL
  Filled 2011-08-17 (×9): qty 1

## 2011-08-17 MED ORDER — SODIUM CHLORIDE 0.9 % IV SOLN
250.0000 mL | INTRAVENOUS | Status: DC | PRN
Start: 2011-08-17 — End: 2011-08-18

## 2011-08-17 MED ORDER — SODIUM CHLORIDE 0.9 % IJ SOLN: 3.0000 mL | INTRAMUSCULAR | Status: DC | PRN

## 2011-08-17 MED ORDER — HEPARIN (PORCINE) IN NACL 100-0.45 UNIT/ML-% IJ SOLN
1150.0000 [IU]/h | INTRAMUSCULAR | Status: DC
  Administered 2011-08-17: 1120 [IU]/h via INTRAVENOUS
  Administered 2011-08-19 – 2011-08-20 (×2): 1150 [IU]/h via INTRAVENOUS
  Filled 2011-08-17 (×4): qty 250

## 2011-08-17 MED ORDER — NITROGLYCERIN 0.4 MG SL SUBL: 0.4000 mg | SUBLINGUAL_TABLET | SUBLINGUAL | Status: DC | PRN

## 2011-08-17 MED ORDER — FUROSEMIDE 10 MG/ML IJ SOLN
40.0000 mg | Freq: Two times a day (BID) | INTRAMUSCULAR | Status: DC
  Administered 2011-08-17: 40 mg via INTRAVENOUS
  Filled 2011-08-17 (×4): qty 4

## 2011-08-17 MED ORDER — LISINOPRIL 5 MG PO TABS
5.0000 mg | ORAL_TABLET | Freq: Every day | ORAL | Status: DC
  Administered 2011-08-17: 5 mg via ORAL
  Filled 2011-08-17 (×2): qty 1

## 2011-08-17 MED ORDER — SODIUM CHLORIDE 0.9 % IJ SOLN
3.0000 mL | Freq: Two times a day (BID) | INTRAMUSCULAR | Status: DC
  Administered 2011-08-17 (×2): 3 mL via INTRAVENOUS

## 2011-08-17 NOTE — H&P (Signed)
NAME:  [name], [name] ROOM: 250  UNIT NUMBER:  [id number] LOCATION: [facility] 250 01 ADM/VISIT DATE:  08/16/2011   ADM Harry BrownerKathaleen Weiss:  [id number] DOB: 1931/11/10   SUBJECTIVE:  The patient is less short of breath this morning.  He feels better.  He remains in atrial fibrillation, albeit with rate control.  He denies any cough, fever or chills.  He has been placed on anticoagulation with IV heparin.  OBJECTIVE:  Vital signs:  Blood pressure 150/51, heart rate 94 beats per minute, respirations are 25, saturation 92% on 2 liter O2.  HEENT:  Normal carotid upstroke.  No carotid bruits.  JVP is 8 cm lying flat.  Lungs:  Clear breath sounds with a few crackles at the bases.  Heart:  Irregular rate and rhythm with normal S1, S2.  A soft systolic murmur at the right upper sternal border and a loud 3 to 4/6 holosystolic murmur at the apex radiating to the left axilla.  Abdomen:  Soft, nontender.  Extremities:  No cyanosis, clubbing or edema.  LABORATORY WORK:  BMET on 08/17/2011:  Glucose 136, BUN 34, creatinine 1.46, calcium 8.4, sodium 136, potassium 3.4, CO2 is 25.  Cardiac enzymes are 0.2, 0.19 and 0.23.  CK-MB index is negative.  Coagulation parameters:  Unfractionated heparin level 0.54, INR is 1.1.  Chemistries:  BNP initially 12.8; today is 743.  Echocardiogram on 08/16/2011:  Severely dilated left ventricle with an ejection fraction of 35%.  Moderate to severely dilated left atrium.  RV function mildly reduced.  Status post AVR.  Questionable severe mitral regurgitation.  Mild tricuspid regurgitation.  ASSESSMENT: 1. Acute on chronic systolic heart failure (ejection fraction 35% - improved, status post diuresis). 2. Paroxysmal atrial fibrillation with rapid ventricular response. a. Status post cardioversion 11/2007, normal sinus rhythm. b. CHADS score is 3. c. Recurrent atrial fibrillation, rate controlled. 3. Valvular heart disease. a. Status post bioprosthetic aortic valve replacement 08/2007  secondary to severe aortic insufficiency. b. New pathologic murmur, consistent with severe mitral regurgitation. c. Multivessel coronary artery disease, status post coronary artery bypass grafting 08/2007. 4. Mild renal insufficiency. 5. Abnormal troponins.  PLAN: 1. We will proceed with TEE to assess the patient's mitral regurgitation.  It may be severe.  We will have to evaluate what the etiology is.  It could be secondary to mitral annular dilatation due to left ventricular enlargement, it may be a functional. 2. The patient probably would benefit from cardioversion, although his left atrium is significantly enlarged, and we probably have to start him on amiodarone. 3. Continue IV heparin and overlap with Coumadin once the patient has been cardioverted. 4. May need decision regarding mitral valve repair/replacement pending the results of the TEE this morning.   __________________________    [name], M.Weiss. [name]: 08/17/2011 1191 T: 08/17/2011 0914 P: [name]   Addendum:  Bedside TEE was performed this morning. The patient was hemodynamically stable with good heart rate control and he was felt stable her cardiac and pulmonary perspective to undergo the procedure.  Please refer to the formal report on the TEE. In summary however the patient has LV dysfunction with an ejection fraction of 35% with a large area of lateral and inferior akinesis. There is ischemic mitral regurgitation with a tethered  appearance of the posterior leaflet, in addition there is a partial flail likely of P2 of the posterior mitral valve leaflet causing a very eccentric jet which runs along the arterial side of the anterior leaflet and swings  around the atrium into the pulmonary veins. This jet although narrow at its origin is consistent with severe mitral regurgitation. The left atrium is significantly enlarged.  I left a message with the OR nurse for Dr. Cornelius Moras. The patient will be transferred to [facility] for further management of atrial fibrillation and acute on chronic systolic heart failure. I did not proceed with cardioversion although there was no evidence of thrombus in the left atrial appendage was clean. The patient will need an evaluation for possible mitral valve repair and Cox-Maze procedure. The patient would be at high risk given his LV dysfunction and prior history of coronary bypass grafting and aortic valve replacement. In addition prior to any decision for surgery the patient will proceed with left and right heart catheterization to assess his coronary anatomy.  I have discussed the plan with [name] and his son and they are in agreement for transfer to [facility] for catheterization and received an opinion regarding possible surgery.  A copy of the TEE procedure in the form of a CD has been provided.  [name] [facility] 08/17/2011 11:25 AM

## 2011-08-17 NOTE — Progress Notes (Signed)
ANTICOAGULATION CONSULT NOTE - Follow Up Consult  Pharmacy Consult for Heparin Indication: atrial fibrillation  No Known Allergies  Patient Measurements: Height: 6' (182.9 cm) Weight: 178 lb 9.2 oz (81 kg) IBW/kg (Calculated) : 77.6   Vital Signs: Temp: 98.3 F (36.8 C) (05/09 1559) Temp src: Oral (05/09 1559) BP: 135/85 mmHg (05/09 1800) Pulse Rate: 52  (05/09 1800)  Labs:  Basename 08/17/11 1825 08/17/11 1300  HGB -- 15.2  HCT -- 43.3  PLT -- 160  APTT -- --  LABPROT -- 14.6  INR -- 1.12  HEPARINUNFRC 0.30 --  CREATININE -- --  CKTOTAL -- --  CKMB -- --  TROPONINI -- --    Estimated Creatinine Clearance: 43.8 ml/min (by C-G formula based on Cr of 1.55).   Medications:  Infusions:    . sodium chloride    . heparin 1,120 Units/hr (08/17/11 1423)    Assessment: Atrial Fibrillation: Initial Heparin level is therapeutic, but at the lower end of the therapeutic range.  Goal of Therapy:  Heparin level 0.3-0.7 units/ml Monitor platelets by anticoagulation protocol: Yes   Plan:  Increase Heparin to 1150 units/hr Check Heparin level and CBC with AM labs.  Estella Husk, Pharm.D., BCPS Clinical Pharmacist  Pager [phone] 08/17/2011, 7:15 PM

## 2011-08-17 NOTE — Progress Notes (Signed)
ADMITTED FROM  [facility] THRU [facility] AWAKE AND ALERT , WITH HEPARIN GTT ON GOING TO LEFT AC. MADE COMFORTABLE ON BED.

## 2011-08-17 NOTE — Progress Notes (Signed)
TCTS BRIEF PROGRESS NOTE   Full consultation pending.  I would favor catheterization via left groin or radial artery, with right heart catheterization and aortography of descending thoracic and abdominal aorta to complete his cardiac workup and facilitate decision making.  Zeki Bedrosian H 08/17/2011 9:17 PM

## 2011-08-17 NOTE — Progress Notes (Signed)
ANTICOAGULATION CONSULT NOTE - Initial Consult  Pharmacy Consult for heparin Indication: atrial fibrillation  Allergies no known allergies  Patient Measurements: Height: 6' (182.9 cm) Weight: 178 lb 9.2 oz (81 kg) IBW/kg (Calculated) : 77.6  Heparin Dosing Weight: 81kg  Vital Signs: Temp: 98.3 F (36.8 C) (05/09 1251) Temp src: Oral (05/09 1251) BP: 127/100 mmHg (05/09 1251) Pulse Rate: 99  (05/09 1251)  Labs:  Basename 08/17/11 1300  HGB 15.2  HCT 43.3  PLT 160  APTT --  LABPROT 14.6  INR 1.12  HEPARINUNFRC --  CREATININE --  CKTOTAL --  CKMB --  TROPONINI --    Estimated Creatinine Clearance: 43.8 ml/min (by C-G formula based on Cr of 1.55).   Medical History: Past Medical History  Diagnosis Date  . CAD (coronary artery disease)     artery bypass graft  . Decreased left ventricular function     Medications:  Prescriptions prior to admission  Medication Sig Dispense Refill  . aspirin 81 MG tablet Take 81 mg by mouth daily.        . carvedilol (COREG) 3.125 MG tablet Take 3.125 mg by mouth 2 (two) times daily with a meal.        . lisinopril (PRINIVIL,ZESTRIL) 20 MG tablet Take 20 mg by mouth daily.        . nitroGLYCERIN (NITROSTAT) 0.4 MG SL tablet Place 0.4 mg under the tongue every 5 (five) minutes as needed.        . simvastatin (ZOCOR) 40 MG tablet Take 40 mg by mouth at bedtime.          Assessment: 79 yom transferred from [facility] already on IV heparin for afib. Dose was adjusted this AM at approximately 1030 per [facility] documentation. Pt also received one dose of coumadin 5mg  last night.   Goal of Therapy:  Heparin level 0.3-0.7 units/ml Monitor platelets by anticoagulation protocol: Yes   Plan:  1. Continue heparin at 1120units/hr as currently running 2. Check a heparin level at 1830 (8 hours post-rate change) 3. Daily heparin level and CBC  Addaleigh Nicholls, Drake Leach 08/17/2011,2:11 PM

## 2011-08-18 ENCOUNTER — Encounter (HOSPITAL_COMMUNITY): Payer: Self-pay | Admitting: Thoracic Surgery (Cardiothoracic Vascular Surgery)

## 2011-08-18 ENCOUNTER — Ambulatory Visit (HOSPITAL_COMMUNITY): Admit: 2011-08-18 | Payer: Medicare Other | Admitting: Cardiology

## 2011-08-18 ENCOUNTER — Inpatient Hospital Stay (HOSPITAL_COMMUNITY): Payer: Medicare Other

## 2011-08-18 ENCOUNTER — Encounter (HOSPITAL_COMMUNITY)
Admission: AD | Disposition: A | Discharge status: Home Health | Payer: Self-pay | Source: Other Acute Inpatient Hospital | Attending: Thoracic Surgery (Cardiothoracic Vascular Surgery)

## 2011-08-18 DIAGNOSIS — I509 Heart failure, unspecified: Secondary | ICD-10-CM

## 2011-08-18 DIAGNOSIS — K279 Peptic ulcer, site unspecified, unspecified as acute or chronic, without hemorrhage or perforation: Secondary | ICD-10-CM

## 2011-08-18 DIAGNOSIS — I5023 Acute on chronic systolic (congestive) heart failure: Secondary | ICD-10-CM

## 2011-08-18 DIAGNOSIS — I255 Ischemic cardiomyopathy: Secondary | ICD-10-CM | POA: Diagnosis present

## 2011-08-18 DIAGNOSIS — I2510 Atherosclerotic heart disease of native coronary artery without angina pectoris: Secondary | ICD-10-CM | POA: Diagnosis present

## 2011-08-18 DIAGNOSIS — Z01810 Encounter for preprocedural cardiovascular examination: Secondary | ICD-10-CM

## 2011-08-18 DIAGNOSIS — I4819 Other persistent atrial fibrillation: Secondary | ICD-10-CM | POA: Diagnosis present

## 2011-08-18 DIAGNOSIS — Z903 Acquired absence of stomach [part of]: Secondary | ICD-10-CM | POA: Insufficient documentation

## 2011-08-18 DIAGNOSIS — N189 Chronic kidney disease, unspecified: Secondary | ICD-10-CM | POA: Diagnosis present

## 2011-08-18 DIAGNOSIS — I059 Rheumatic mitral valve disease, unspecified: Secondary | ICD-10-CM

## 2011-08-18 DIAGNOSIS — I340 Nonrheumatic mitral (valve) insufficiency: Secondary | ICD-10-CM

## 2011-08-18 DIAGNOSIS — I4891 Unspecified atrial fibrillation: Secondary | ICD-10-CM

## 2011-08-18 HISTORY — DX: Peptic ulcer, site unspecified, unspecified as acute or chronic, without hemorrhage or perforation: K27.9

## 2011-08-18 HISTORY — DX: Atherosclerotic heart disease of native coronary artery without angina pectoris: I25.10

## 2011-08-18 HISTORY — DX: Nonrheumatic mitral (valve) insufficiency: I34.0

## 2011-08-18 HISTORY — DX: Heart failure, unspecified: I50.9

## 2011-08-18 HISTORY — DX: Acquired absence of stomach (part of): Z90.3

## 2011-08-18 LAB — CBC
MCH: 31.1 pg (ref 26.0–34.0)
MCV: 90.2 fL (ref 78.0–100.0)
Platelets: 173 10*3/uL (ref 150–400)
RBC: 4.47 MIL/uL (ref 4.22–5.81)
RDW: 13.5 % (ref 11.5–15.5)

## 2011-08-18 LAB — BASIC METABOLIC PANEL
BUN: 44 mg/dL — ABNORMAL HIGH (ref 6–23)
BUN: 43 mg/dL — ABNORMAL HIGH (ref 6–23)
CO2: 27 mEq/L (ref 19–32)
Calcium: 9 mg/dL (ref 8.4–10.5)
Calcium: 9.1 mg/dL (ref 8.4–10.5)
Creatinine, Ser: 1.96 mg/dL — ABNORMAL HIGH (ref 0.50–1.35)
GFR calc non Af Amer: 30 mL/min — ABNORMAL LOW (ref >90)
Glucose, Bld: 123 mg/dL — ABNORMAL HIGH (ref 70–99)

## 2011-08-18 SURGERY — LEFT AND RIGHT HEART CATHETERIZATION WITH CORONARY/GRAFT ANGIOGRAM
Anesthesia: LOCAL

## 2011-08-18 MED ORDER — ALBUTEROL SULFATE (5 MG/ML) 0.5% IN NEBU
2.5000 mg | INHALATION_SOLUTION | Freq: Once | RESPIRATORY_TRACT | Status: AC
  Administered 2011-08-18: 2.5 mg via RESPIRATORY_TRACT

## 2011-08-18 MED ORDER — SODIUM CHLORIDE 0.9 % IV SOLN: INTRAVENOUS | Status: AC

## 2011-08-18 MED ORDER — SODIUM CHLORIDE 0.9 % IJ SOLN
3.0000 mL | Freq: Two times a day (BID) | INTRAMUSCULAR | Status: DC
  Administered 2011-08-18 – 2011-08-24 (×10): 3 mL via INTRAVENOUS

## 2011-08-18 MED FILL — Heparin Sodium (Porcine) 100 Unt/ML in Sodium Chloride 0.45%: INTRAMUSCULAR | Qty: 250 | Status: AC

## 2011-08-18 NOTE — Consult Note (Signed)
CARDIOTHORACIC SURGERY CONSULTATION REPORT  PCP is Harry Bottoms, MD, MD Referring Provider is Harry Bottoms, MD, MD   Reason for consultation:  Severe mitral regurgitation and persistent atrial fibrillation  HPI:  Patient is a 78 yo recently widowed male from [place], [place] with history of congestive heart failure and severe left ventricular dysfunction who underwent aortic valve replacement and CABG x3 on 08/15/2007 for bicuspid aortic valve disease with severe aortic insufficiency and 3-vessel CAD.  Postoperatively the patient developed persistent atrial fibrillation for which he underwent successful cardioversion 3 months later.  He apparently did quite well until recently.  He states that he has always had some mild exertional shortness of breath, but approximately 2 or 3 weeks ago he developed much more severe shortness of breath which now has been occurring with minimal activity and at rest.  He presented to [facility] where he was admitted in atrial fibrillation with acute on chronic congestive heart failure with pulmonary edema.  Symptoms have improved on medical therapy.  Both transthoracic and transesophageal echocardiograms demonstrate severe mitral regurgitation.  There is severe LV chamber enlargement with moderate to severe LV systolic dysfunction, EF estimated 35%.  The bioprosthetic aortic valve is functioning normally.  The patient was transferred to [facility] for cardiac catheterization and surgical consultation.  Past Medical History  Diagnosis Date  . CAD (coronary artery disease)     artery bypass graft  . Decreased left ventricular function   . Shortness of breath   . Heart murmur   . Hypertension   . S/P aortic valve replacement with bioprosthetic valve 08/15/2007    25 mm Edwards Magna pericardial tissue valve for bicuspid aortic valve disease with severe aortic insufficiency  . Chronic kidney disease (CKD)   . Atrial fibrillation, persistent   . Congestive  heart failure 08/18/2011    Acute on chronic systolic and diastolic  . Mitral regurgitation 08/18/2011  . Coronary artery disease 08/18/2011    S/p CABG x 3 on 08/15/2007 - LIMA to LAD, SVG to OM, SVG to LPDA  . Peptic ulcer disease 08/18/2011    Remote history of partial gastrectomy for ulcer disease  . S/P partial gastrectomy 08/18/2011    Past Surgical History  Procedure Date  . Coronary artery bypass graft 08/15/2007    CABG x3  . Aortic valve replacement 08/15/2007    25 mm [facility]  . Cardiac catheterization   . Partial gastrectomy   . Cardioversion 11/2007    History reviewed. No pertinent family history.  Social History History  Substance Use Topics  . Smoking status: Never Smoker   . Smokeless tobacco: Not on file  . Alcohol Use: No    Prior to Admission medications   Medication Sig Start Date End Date Taking? Authorizing Provider  aspirin EC 81 MG tablet Take 81 mg by mouth daily.   Yes Historical Provider, MD  Aspirin-Salicylamide-Caffeine (BC HEADACHE POWDER PO) Take 1 packet by mouth as needed. For headaches.   Yes Historical Provider, MD  carvedilol (COREG) 3.125 MG tablet Take 3.125 mg by mouth 2 (two) times daily with a meal.    Yes Historical Provider, MD  lisinopril (PRINIVIL,ZESTRIL) 20 MG tablet Take 20 mg by mouth daily.    Yes Historical Provider, MD  nitroGLYCERIN (NITROSTAT) 0.4 MG SL tablet Place 0.4 mg under the tongue every 5 (five) minutes as needed. For chest pain.   Yes Historical Provider, MD  Current Facility-Administered Medications  Medication Dose Route Frequency Provider Last Rate Last Dose  . 0.9 %  sodium chloride infusion   Intravenous Continuous Herby Abraham, MD 75 mL/hr at 08/18/11 1230    . acetaminophen (TYLENOL) tablet 650 mg  650 mg Oral Q4H PRN Rande Brunt, PA      . albuterol (PROVENTIL) (5 MG/ML) 0.5% nebulizer solution 2.5 mg  2.5 mg Nebulization Once [name], MD   2.5 mg at 08/18/11 0826  . aspirin  chewable tablet 324 mg  324 mg Oral NOW Rande Brunt, PA   324 mg at 08/17/11 1703  . aspirin chewable tablet 324 mg  324 mg Oral Pre-Cath Rande Brunt, PA   324 mg at 08/18/11 0554  . aspirin EC tablet 81 mg  81 mg Oral Daily Rande Brunt, PA   81 mg at 08/18/11 1015  . atorvastatin (LIPITOR) tablet 80 mg  80 mg Oral q1800 Rande Brunt, PA   80 mg at 08/17/11 1703  . carvedilol (COREG) tablet 3.125 mg  3.125 mg Oral BID WC Rande Brunt, PA   3.125 mg at 08/18/11 1015  . heparin ADULT infusion 100 units/mL (25000 units/250 mL)  1,150 Units/hr Intravenous Continuous Madolyn Frieze, PHARMD 11.5 mL/hr at 08/17/11 2000 1,150 Units/hr at 08/17/11 2000  . nitroGLYCERIN (NITROSTAT) SL tablet 0.4 mg  0.4 mg Sublingual Q5 Min x 3 PRN Rande Brunt, PA      . ondansetron [facility]) injection 4 mg  4 mg Intravenous Q6H PRN Rande Brunt, PA      . sodium chloride 0.9 % injection 3 mL  3 mL Intravenous PRN Rande Brunt, PA   3 mL at 08/17/11 2026  . sodium chloride 0.9 % injection 3 mL  3 mL Intravenous PRN Rande Brunt, PA      . DISCONTD: 0.9 %  sodium chloride infusion  250 mL Intravenous PRN Rande Brunt, PA      . DISCONTD: 0.9 %  sodium chloride infusion   Intravenous Continuous Rande Brunt, PA 50 mL/hr at 08/18/11 0512    . DISCONTD: diazepam (VALIUM) tablet 5 mg  5 mg Oral On Call Rande Brunt, PA      . DISCONTD: furosemide (LASIX) injection 40 mg  40 mg Intravenous BID Rande Brunt, PA   40 mg at 08/17/11 1703  . DISCONTD: lisinopril (PRINIVIL,ZESTRIL) tablet 5 mg  5 mg Oral Daily Rande Brunt, PA   5 mg at 08/17/11 1703  . DISCONTD: sodium chloride 0.9 % injection 3 mL  3 mL Intravenous Q12H Rande Brunt, PA   3 mL at 08/17/11 2027    No Known Allergies  Review of Systems:  General:  normal appetite, normal energy   Respiratory:  no cough, no wheezing, no hemoptysis, no pain with inspiration or cough, + shortness of breath   Cardiac:   no chest pain or  tightness, + exertional SOB, + resting SOB, no PND, no orthopnea, no LE edema, + palpitations, no syncope, + occasional dizzy spells  GI:   no difficulty swallowing, + occasional small amounts of hematochezia, no hematemesis, no melena, no constipation, no diarrhea   GU:   no dysuria, no urgency, no frequency   Musculoskeletal: no arthritis, no arthralgia   Vascular:  no pain suggestive of claudication but "legs get weak" when he walks  Neuro:   no symptoms suggestive of TIA's, no seizures,  no headaches, no peripheral neuropathy   Endocrine:  Negative   HEENT:  Edentulous,  no recent vision changes  Psych:   no anxiety, + depression since wife's passing    Physical Exam:   BP 105/84  Pulse 91  Temp(Src) 98.1 F (36.7 C) (Oral)  Resp 23  Ht 6' (1.829 m)  Wt 82 kg (180 lb 12.4 oz)  BMI 24.52 kg/m2  SpO2 96%  General:  Thin, slightly frail-appearing  HEENT:  Unremarkable   Neck:   no JVD, no bruits, no adenopathy   Chest:   clear to auscultation, symmetrical breath sounds, no wheezes, no rhonchi, well-healed sternotomy scar  CV:   RRR, grade III/VI holosystolic murmur   Abdomen:  soft, non-tender, no masses   Extremities:  warm, well-perfused, pulses not palpable, scar on distal right thigh from [facility]  Rectal/GU  Deferred  Neuro:   Grossly non-focal and symmetrical throughout  Skin:   Clean and dry, no rashes, no breakdown  Diagnostic Tests:  Transesophageal echocardiogram performed at [facility] by Dr Andee Lineman is reviewed.  There is severe LV chamber enlargement with severe global systolic dysfunction, EF 35%.  There is severe (4+) mitral regurgitation which is primarily due to fibroelastic deficiency with type II dysfunction related to a small flail segment of the posterior leaflet that I believe may be close to and/or involving the posterior commissure.  There is also type I and type IIIb dysfunction related to severe global LV dysfunction including both annular dilatation and  systolic tethering of the posterior leaflet.  The bioprosthetic tissue valve in the aortic position is functioning normally.  The left atrium is dilated.   Impression:  Acute on chronic systolic congestive heart failure (class IV) due to likely recent ruptured chords of mitral valve with severe mitral regurgitation.  The patient has preexisting underlying severe LV systolic dysfunction and chamber enlargement due to his history of aortic insufficiency and CAD for which he underwent AVR and CABG in 2009.  The patient has also developed recurrent persistent atrial fibrillation.  Risks associated with surgical intervention will be considerable, but I agree with proceeding with cardiac catheterization to further progress with work up.   Plan:  Once renal function has stabilized I favor left and right heart catheterization, with left heart cath and aortography via either radial artery or left femoral artery approach.  In the absence of significant progression of CAD or vein graft disease, the patient's mitral regurgitation and atrial fibrillation might be best approached using minimally invasive techniques to avoid issues related to his previous surgery.    Salvatore Decent. Cornelius Moras, MD 08/18/2011 4:49 PM

## 2011-08-18 NOTE — Progress Notes (Addendum)
Patient Name: [name] Date of Encounter: 08/18/2011     Principal Problem:  *Acute on chronic systolic CHF (congestive heart failure) Active Problems:  S/P CABG x 3  Coronary artery disease  Severe mitral regurgitation  Atrial fibrillation, persistent  Chronic kidney disease (CKD)  Ischemic cardiomyopathy  S/P aortic valve replacement with bioprosthetic valve  HYPERLIPIDEMIA  Hypertension    SUBJECTIVE  No chest pain.  Breathing stable.  Creat up today.  Cath postponed.  CURRENT MEDS    . albuterol  2.5 mg Nebulization Once  . aspirin  324 mg Oral NOW  . aspirin  324 mg Oral Pre-Cath  . aspirin EC  81 mg Oral Daily  . atorvastatin  80 mg Oral q1800  . carvedilol  3.125 mg Oral BID WC  . diazepam  5 mg Oral On Call  . furosemide  40 mg Intravenous BID  . lisinopril  5 mg Oral Daily  . sodium chloride  3 mL Intravenous Q12H  . DISCONTD: aspirin  300 mg Rectal NOW  . DISCONTD: sodium chloride  3 mL Intravenous Q12H    OBJECTIVE  Filed Vitals:   08/18/11 0500 08/18/11 0520 08/18/11 0719 08/18/11 0721  BP:  102/68 79/56 79/56   Pulse:  88 96 48  Temp:   98.1 F (36.7 C)   TempSrc:   Oral   Resp:  24 21 19   Height:      Weight: 180 lb 12.4 oz (82 kg)     SpO2:  97% 94% 89%    Intake/Output Summary (Last 24 hours) at 08/18/11 0944 Last data filed at 08/18/11 0800  Gross per 24 hour  Intake  745.5 ml  Output    300 ml  Net  445.5 ml   Filed Weights   08/17/11 1251 08/18/11 0500  Weight: 178 lb 9.2 oz (81 kg) 180 lb 12.4 oz (82 kg)    PHYSICAL EXAM  General: Pleasant, NAD. Neuro: Alert and oriented X 3. Moves all extremities spontaneously. Psych: Normal affect. HEENT:  Normal  Neck: Supple without bruits or JVD. Lungs:  Resp regular and unlabored, w/ bibasilar crackles. Heart: Irreg, Irreg, no s3, s4.  3/6 syst murmur @ apex. Abdomen: Soft, non-tender, non-distended, BS + x 4.  Extremities: No clubbing, cyanosis or edema. DP/PT/Radials 2+  and equal bilaterally.  Accessory Clinical Findings  CBC  Basename 08/18/11 0440 08/17/11 1300  WBC 8.1 9.3  NEUTROABS -- --  HGB 13.9 15.2  HCT 40.3 43.3  MCV 90.2 89.6  PLT 173 160   Basic Metabolic Panel  Basename 08/18/11 0440  NA 136  K 3.7  CL 96  CO2 26  GLUCOSE 123*  BUN 44*  CREATININE 2.03*  CALCIUM 9.0  MG --  PHOS --   TELE  Afib, pvc's/couplets, 80's to low 100's.  Radiology/Studies  No results found.  ASSESSMENT AND PLAN  1.  Acute on chronic systolic CHF:  Breathing improved.  He appears euvolemic.  His weight has been variable - listed @ 181 on 5/8 [facility]), then 178 last night here, and 180 this AM.  His BUN and Creat are up (34/1.46->44/2.03).  Will hold lasix and acei.  Cancel cath for today and plan for Monday instead provided that renal fxn stabilizes.  Cont bb.  2.  CAD:  Elevated troponin in setting of #1.  NL CK's and MB's @ [facility].  No chest pain.  As above, cath was scheduled for today to define anatomy and measure right  heart pressures prior to TCTS eval for mitral regurgitation, however will be postponed 2/2 acute on chronic kidney dzs.  D/C IVF orders given #1.  Cont asa, heparin, statin, bb.  3.  Severe MR:  S/p TEE.  Pending R&L heart cath as above.  TCTS on board.  4.  Acute on Chronic stage III Kidney Dzs:  Creat up in setting of diuresis.  Hold lasix and acei today.  Follow.  5.  A.Fib:  Chronic.  Relatively rate controlled.  Cont heparin.  Signed, Nicolasa Ducking NP  Patient seen and examined, and case discussed with Dr. Andee Lineman.  He says that he was able to lie flat yesterday during TEE procedure and he would favor some gentle hydration, thinking he may be overdiuresed a bit.  His rate is controlled and BP soft.  I agree with the exam of C Berge---he has a loud holosystolic murmur radiating to the axilla consistent with MR.  Plan R and L heart cath on Monday.

## 2011-08-18 NOTE — Progress Notes (Signed)
PFT completed. Unconfirmed copy placed in Progress Notes of [facility]. 

## 2011-08-18 NOTE — Progress Notes (Signed)
ANTICOAGULATION CONSULT NOTE - Follow Up Consult  Pharmacy Consult for Heparin Indication: atrial fibrillation  No Known Allergies  Patient Measurements: Height: 6' (182.9 cm) Weight: 180 lb 12.4 oz (82 kg) IBW/kg (Calculated) : 77.6   Vital Signs: Temp: 98.1 F (36.7 C) (05/10 0719) Temp src: Oral (05/10 0719) BP: 79/56 mmHg (05/10 0721) Pulse Rate: 48  (05/10 0721)  Labs:  Basename 08/18/11 0440 08/17/11 1825 08/17/11 1300  HGB 13.9 -- 15.2  HCT 40.3 -- 43.3  PLT 173 -- 160  APTT -- -- --  LABPROT -- -- 14.6  INR -- -- 1.12  HEPARINUNFRC 0.37 0.30 --  CREATININE 2.03* -- --  CKTOTAL -- -- --  CKMB -- -- --  TROPONINI -- -- --    Estimated Creatinine Clearance: 33.4 ml/min (by C-G formula based on Cr of 2.03).   Medications:  Infusions:     . sodium chloride 50 mL/hr at 08/18/11 8295  . heparin 1,150 Units/hr (08/17/11 2000)    Assessment: Atrial Fibrillation: HL therapeutic this AM, CBC stable  Goal of Therapy:  Heparin level 0.3-0.7 units/ml Monitor platelets by anticoagulation protocol: Yes   Plan:  Continue heparin at 1150 units / hr Follow up AM labs  Okey Regal, PharmD Clinical Pharmacist [phone]  08/18/2011, 8:45 AM

## 2011-08-18 NOTE — Progress Notes (Addendum)
Pre-op Cardiac Surgery  Carotid Findings:  No evidence of internal carotid artery stenosis bilaterally. Bilateral antegrade vertebral artery flow. Left vertebral artery appears to have dampened flow.   Upper Extremity Right Left  Brachial Pressures 155 144  Radial Waveforms Tri Tri  Ulnar Waveforms Bi Bi  Palmar Arch (Allen's Test) Obliterate with radial compression, normal with ulnar compression Obliterate with radial compression, normal with ulnar compression   Findings:     Bilateral palpable pedal pulses.   08/18/2011 12:59 PM Elpidio Galea, RDMS, RDCS  Farrel Demark, RDMS

## 2011-08-19 ENCOUNTER — Inpatient Hospital Stay (HOSPITAL_COMMUNITY): Payer: Medicare Other

## 2011-08-19 LAB — COMPREHENSIVE METABOLIC PANEL
ALT: 52 U/L (ref 0–53)
AST: 37 U/L (ref 0–37)
CO2: 29 mEq/L (ref 19–32)
Chloride: 102 mEq/L (ref 96–112)
Creatinine, Ser: 1.54 mg/dL — ABNORMAL HIGH (ref 0.50–1.35)
GFR calc non Af Amer: 42 mL/min — ABNORMAL LOW (ref >90)
Glucose, Bld: 134 mg/dL — ABNORMAL HIGH (ref 70–99)
Sodium: 140 mEq/L (ref 135–145)
Total Bilirubin: 1 mg/dL (ref 0.3–1.2)

## 2011-08-19 LAB — PRO B NATRIURETIC PEPTIDE: Pro B Natriuretic peptide (BNP): 3618 pg/mL — ABNORMAL HIGH (ref 0–450)

## 2011-08-19 LAB — CBC
Hemoglobin: 14 g/dL (ref 13.0–17.0)
MCH: 31.7 pg (ref 26.0–34.0)
MCHC: 35.4 g/dL (ref 30.0–36.0)
RDW: 13.7 % (ref 11.5–15.5)

## 2011-08-19 LAB — APTT: aPTT: 103 seconds — ABNORMAL HIGH (ref 24–37)

## 2011-08-19 LAB — HEMOGLOBIN A1C: Mean Plasma Glucose: 131 mg/dL — ABNORMAL HIGH (ref <117)

## 2011-08-19 LAB — LIPID PANEL: LDL Cholesterol: 89 mg/dL (ref 0–99)

## 2011-08-19 NOTE — Progress Notes (Signed)
SUBJECTIVE:  No chest pain.  He is mildly SOB lying on his left side   PHYSICAL EXAM Filed Vitals:   08/19/11 0319 08/19/11 0330 08/19/11 0500 08/19/11 0720  BP: 100/79   119/88  Pulse:  95  87  Temp: 98.7 F (37.1 C)   98.5 F (36.9 C)  TempSrc: Oral   Oral  Resp: 22 22    Height:      Weight:   183 lb 10.3 oz (83.3 kg)   SpO2: 96% 95%  96%   General:  No distress Lungs:  Few basilar crackles Heart:  Irregular Abdomen:  Positive bowel sounds, no rebound no guarding. Extremities:  No edema  LABS: No results found for this basename: CKTOTAL, CKMB, CKMBINDEX, TROPONINI   Results for orders placed during the hospital encounter of 08/17/11 (from the past 24 hour(s))  BASIC METABOLIC PANEL     Status: Abnormal   Collection Time   08/18/11 10:31 AM      Component Value Range   Sodium 137  135 - 145 (mEq/L)   Potassium 4.1  3.5 - 5.1 (mEq/L)   Chloride 98  96 - 112 (mEq/L)   CO2 27  19 - 32 (mEq/L)   Glucose, Bld 116 (*) 70 - 99 (mg/dL)   BUN 43 (*) 6 - 23 (mg/dL)   Creatinine, Ser 1.61 (*) 0.50 - 1.35 (mg/dL)   Calcium 9.1  8.4 - 09.6 (mg/dL)   GFR calc non Af Amer 31 (*) >90 (mL/min)   GFR calc Af Amer 36 (*) >90 (mL/min)  HEPARIN LEVEL (UNFRACTIONATED)     Status: Normal   Collection Time   08/19/11  5:35 AM      Component Value Range   Heparin Unfractionated 0.45  0.30 - 0.70 (IU/mL)  CBC     Status: Normal   Collection Time   08/19/11  5:35 AM      Component Value Range   WBC 7.8  4.0 - 10.5 (K/uL)   RBC 4.41  4.22 - 5.81 (MIL/uL)   Hemoglobin 14.0  13.0 - 17.0 (g/dL)   HCT 04.5  40.9 - 81.1 (%)   MCV 89.8  78.0 - 100.0 (fL)   MCH 31.7  26.0 - 34.0 (pg)   MCHC 35.4  30.0 - 36.0 (g/dL)   RDW 91.4  78.2 - 95.6 (%)   Platelets 195  150 - 400 (K/uL)  APTT     Status: Abnormal   Collection Time   08/19/11  5:35 AM      Component Value Range   aPTT 103 (*) 24 - 37 (seconds)  PROTIME-INR     Status: Normal   Collection Time   08/19/11  5:35 AM      Component  Value Range   Prothrombin Time 14.8  11.6 - 15.2 (seconds)   INR 1.14  0.00 - 1.49   COMPREHENSIVE METABOLIC PANEL     Status: Abnormal   Collection Time   08/19/11  5:35 AM      Component Value Range   Sodium 140  135 - 145 (mEq/L)   Potassium 4.7  3.5 - 5.1 (mEq/L)   Chloride 102  96 - 112 (mEq/L)   CO2 29  19 - 32 (mEq/L)   Glucose, Bld 134 (*) 70 - 99 (mg/dL)   BUN 35 (*) 6 - 23 (mg/dL)   Creatinine, Ser 2.13 (*) 0.50 - 1.35 (mg/dL)   Calcium 9.6  8.4 - 08.6 (mg/dL)   Total  Protein 6.7  6.0 - 8.3 (g/dL)   Albumin 3.5  3.5 - 5.2 (g/dL)   AST 37  0 - 37 (U/L)   ALT 52  0 - 53 (U/L)   Alkaline Phosphatase 63  39 - 117 (U/L)   Total Bilirubin 1.0  0.3 - 1.2 (mg/dL)   GFR calc non Af Amer 42 (*) >90 (mL/min)   GFR calc Af Amer 48 (*) >90 (mL/min)  LIPID PANEL     Status: Abnormal   Collection Time   08/19/11  5:35 AM      Component Value Range   Cholesterol 142  0 - 200 (mg/dL)   Triglycerides 161  <096 (mg/dL)   HDL 28 (*) >04 (mg/dL)   Total CHOL/HDL Ratio 5.1     VLDL 25  0 - 40 (mg/dL)   LDL Cholesterol 89  0 - 99 (mg/dL)  PRO B NATRIURETIC PEPTIDE     Status: Abnormal   Collection Time   08/19/11  5:35 AM      Component Value Range   Pro B Natriuretic peptide (BNP) 3618.0 (*) 0 - 450 (pg/mL)    Intake/Output Summary (Last 24 hours) at 08/19/11 0857 Last data filed at 08/19/11 0800  Gross per 24 hour  Intake    964 ml  Output   1475 ml  Net   -511 ml     ASSESSMENT AND PLAN:  1. Acute on chronic systolic CHF: Breathing OK.  Try to keep I/O about even today.  2. CAD: Elevated troponin in setting of #1. Cath probably Monday.  3. Severe MR: S/p TEE. Pending R&L heart cath as above. (See Dr. Orvan July note.)  4. Acute on Chronic stage III Kidney Dzs: Creatinine is improved.  We will continue to follow and cath when we believe the creat is at the nadir (probably Monday.)    5. A.Fib: Chronic. Relatively rate controlled. Cont heparin.  Rollene Rotunda 08/19/2011 8:57 AM

## 2011-08-19 NOTE — Progress Notes (Signed)
ANTICOAGULATION CONSULT NOTE - Follow Up Consult  Pharmacy Consult for Heparin Indication: atrial fibrillation  No Known Allergies  Patient Measurements: Height: 6' (182.9 cm) Weight: 183 lb 10.3 oz (83.3 kg) IBW/kg (Calculated) : 77.6   Vital Signs: Temp: 98.5 F (36.9 C) (05/11 0720) Temp src: Oral (05/11 0720) BP: 119/88 mmHg (05/11 0720) Pulse Rate: 87  (05/11 0720)  Labs:  Basename 08/19/11 0535 08/18/11 1031 08/18/11 0440 08/17/11 1825 08/17/11 1300  HGB 14.0 -- 13.9 -- --  HCT 39.6 -- 40.3 -- 43.3  PLT 195 -- 173 -- 160  APTT 103* -- -- -- --  LABPROT 14.8 -- -- -- 14.6  INR 1.14 -- -- -- 1.12  HEPARINUNFRC 0.45 -- 0.37 0.30 --  CREATININE 1.54* 1.96* 2.03* -- --  CKTOTAL -- -- -- -- --  CKMB -- -- -- -- --  TROPONINI -- -- -- -- --    Estimated Creatinine Clearance: 44.1 ml/min (by C-G formula based on Cr of 1.54).   Medications:  Infusions:     . sodium chloride 75 mL/hr at 08/18/11 1230  . heparin 1,150 Units/hr (08/19/11 0631)  . DISCONTD: sodium chloride 50 mL/hr at 08/18/11 1610    Assessment: Atrial Fibrillation: 78 yo M transferred from [facility] for possible OHS. PMH: CAD, CHF, CABG, atrial fibrillation, bioprosthetic AVR and CKD. On heparin gtt for afib. HL therapeutic this AM @ 0.45, CBC stable. Once renal function stabilizes, plan for left and right heart cath per cardiothoracic surgery.  Goal of Therapy:  Heparin level 0.3-0.7 units/ml Monitor platelets by anticoagulation protocol: Yes   Plan:  Continue heparin at 1150 units / hr Follow up AM labs  Janace Litten, PharmD [phone]  08/19/2011, 8:58 AM

## 2011-08-20 ENCOUNTER — Inpatient Hospital Stay (HOSPITAL_COMMUNITY): Payer: Medicare Other

## 2011-08-20 DIAGNOSIS — Z01810 Encounter for preprocedural cardiovascular examination: Secondary | ICD-10-CM

## 2011-08-20 LAB — CBC
Platelets: 183 10*3/uL (ref 150–400)
RBC: 4.19 MIL/uL — ABNORMAL LOW (ref 4.22–5.81)
WBC: 7 10*3/uL (ref 4.0–10.5)

## 2011-08-20 LAB — BASIC METABOLIC PANEL
CO2: 25 mEq/L (ref 19–32)
Calcium: 9.9 mg/dL (ref 8.4–10.5)
Chloride: 103 mEq/L (ref 96–112)
Potassium: 4.2 mEq/L (ref 3.5–5.1)
Sodium: 139 mEq/L (ref 135–145)

## 2011-08-20 MED ORDER — SODIUM CHLORIDE 0.9 % IV SOLN
INTRAVENOUS | Status: DC
  Administered 2011-08-21: 04:00:00 via INTRAVENOUS

## 2011-08-20 MED ORDER — SODIUM CHLORIDE 0.9 % IV SOLN
1.0000 mL/kg/h | INTRAVENOUS | Status: DC
Start: 2011-08-21 — End: 2011-08-21
  Administered 2011-08-21: 1 mL/kg/h via INTRAVENOUS
  Administered 2011-08-21: 1.599 mL/kg/h via INTRAVENOUS

## 2011-08-20 MED ORDER — ASPIRIN 81 MG PO CHEW
324.0000 mg | CHEWABLE_TABLET | ORAL | Status: AC
  Administered 2011-08-21: 324 mg via ORAL
  Filled 2011-08-20: qty 4

## 2011-08-20 NOTE — Progress Notes (Signed)
ANTICOAGULATION CONSULT NOTE - Follow Up Consult  Pharmacy Consult for Heparin Indication: atrial fibrillation  No Known Allergies  Patient Measurements: Height: 6' (182.9 cm) Weight: 183 lb 6.8 oz (83.2 kg) IBW/kg (Calculated) : 77.6   Vital Signs: Temp: 98.4 F (36.9 C) (05/12 0423) Temp src: Oral (05/12 0423) BP: 134/77 mmHg (05/12 0423)  Labs:  Basename 08/20/11 0533 08/19/11 0535 08/18/11 1031 08/18/11 0440 08/17/11 1300  HGB 13.0 14.0 -- -- --  HCT 37.6* 39.6 -- 40.3 --  PLT 183 195 -- 173 --  APTT -- 103* -- -- --  LABPROT -- 14.8 -- -- 14.6  INR -- 1.14 -- -- 1.12  HEPARINUNFRC 0.43 0.45 -- 0.37 --  CREATININE 1.33 1.54* 1.96* -- --  CKTOTAL -- -- -- -- --  CKMB -- -- -- -- --  TROPONINI -- -- -- -- --    Estimated Creatinine Clearance: 51.1 ml/min (by C-G formula based on Cr of 1.33).   Medications:  Infusions:     . heparin 1,150 Units/hr (08/20/11 0435)    Assessment: Atrial Fibrillation: 78 yo M transferred from [place] for possible OHS. PMH: CAD, CHF, CABG, atrial fibrillation, bioprosthetic AVR and CKD. On heparin gtt for afib. HL therapeutic this AM @ 0.43, CBC stable. Once renal function stabilizes, plan for left and right heart cath per cardiothoracic surgery (possibly Monday per Dr. Antoine Poche.  Goal of Therapy:  Heparin level 0.3-0.7 units/ml Monitor platelets by anticoagulation protocol: Yes   Plan:  Continue heparin at 1150 units / hr Follow up AM labs  Janace Litten, PharmD [phone]  08/20/2011, 8:18 AM

## 2011-08-20 NOTE — Progress Notes (Signed)
   SUBJECTIVE:  No chest pain.  One spell of SOB this am but feels well now.     PHYSICAL EXAM Filed Vitals:   08/19/11 1533 08/20/11 0000 08/20/11 0423 08/20/11 0500  BP:  120/76 134/77   Pulse:      Temp: 98.3 F (36.8 C) 98.7 F (37.1 C) 98.4 F (36.9 C)   TempSrc: Oral Oral Oral   Resp:  16 18   Height:      Weight:    83.2 kg (183 lb 6.8 oz)  SpO2:   93%    General:  No distress HEENT: PERRL Lungs:  Few basilar crackles with decreased breath sounds on the right. Heart:  Irregular Abdomen:  Positive bowel sounds, no rebound no guarding. Extremities:  No edema Neuro:  Intact, nonfocal  LABS: No results found for this basename: CKTOTAL,  CKMB,  CKMBINDEX,  TROPONINI   Results for orders placed during the hospital encounter of 08/17/11 (from the past 24 hour(s))  HEPARIN LEVEL (UNFRACTIONATED)     Status: Normal   Collection Time   08/20/11  5:33 AM      Component Value Range   Heparin Unfractionated 0.43  0.30 - 0.70 (IU/mL)  CBC     Status: Abnormal   Collection Time   08/20/11  5:33 AM      Component Value Range   WBC 7.0  4.0 - 10.5 (K/uL)   RBC 4.19 (*) 4.22 - 5.81 (MIL/uL)   Hemoglobin 13.0  13.0 - 17.0 (g/dL)   HCT 16.1 (*) 09.6 - 52.0 (%)   MCV 89.7  78.0 - 100.0 (fL)   MCH 31.0  26.0 - 34.0 (pg)   MCHC 34.6  30.0 - 36.0 (g/dL)   RDW 04.5  40.9 - 81.1 (%)   Platelets 183  150 - 400 (K/uL)  BASIC METABOLIC PANEL     Status: Abnormal   Collection Time   08/20/11  5:33 AM      Component Value Range   Sodium 139  135 - 145 (mEq/L)   Potassium 4.2  3.5 - 5.1 (mEq/L)   Chloride 103  96 - 112 (mEq/L)   CO2 25  19 - 32 (mEq/L)   Glucose, Bld 137 (*) 70 - 99 (mg/dL)   BUN 28 (*) 6 - 23 (mg/dL)   Creatinine, Ser 9.14  0.50 - 1.35 (mg/dL)   Calcium 9.9  8.4 - 78.2 (mg/dL)   GFR calc non Af Amer 50 (*) >90 (mL/min)   GFR calc Af Amer 58 (*) >90 (mL/min)    Intake/Output Summary (Last 24 hours) at 08/20/11 0837 Last data filed at 08/20/11 0500  Gross per 24  hour  Intake  481.5 ml  Output   1400 ml  Net -918.5 ml    ASSESSMENT AND PLAN:  1. Acute on chronic systolic CHF: Episode of SOB this am.  I will repeat a portable CXR.  2. CAD: Elevated troponin in setting of #1. Cath Monday.  3. Severe MR: S/p TEE. Pending R&L heart cath as above. (See Dr. Orvan July note.)  4. Acute on Chronic stage III Kidney Dzs: Creatinine is improved.  Cath in am.    5. A.Fib: Chronic. Relatively rate controlled. Cont heparin.  Rollene Rotunda 08/20/2011 8:37 AM

## 2011-08-21 ENCOUNTER — Encounter (HOSPITAL_COMMUNITY): Payer: Self-pay | Admitting: Cardiology

## 2011-08-21 ENCOUNTER — Other Ambulatory Visit: Payer: Self-pay

## 2011-08-21 ENCOUNTER — Encounter (HOSPITAL_COMMUNITY)
Admission: AD | Disposition: A | Discharge status: Home Health | Payer: Self-pay | Source: Other Acute Inpatient Hospital | Attending: Thoracic Surgery (Cardiothoracic Vascular Surgery)

## 2011-08-21 DIAGNOSIS — I739 Peripheral vascular disease, unspecified: Secondary | ICD-10-CM

## 2011-08-21 DIAGNOSIS — I2510 Atherosclerotic heart disease of native coronary artery without angina pectoris: Secondary | ICD-10-CM

## 2011-08-21 DIAGNOSIS — I059 Rheumatic mitral valve disease, unspecified: Secondary | ICD-10-CM

## 2011-08-21 HISTORY — PX: LEFT AND RIGHT HEART CATHETERIZATION WITH CORONARY ANGIOGRAM: SHX5449

## 2011-08-21 LAB — CBC
HCT: 36.9 % — ABNORMAL LOW (ref 39.0–52.0)
Hemoglobin: 12.6 g/dL — ABNORMAL LOW (ref 13.0–17.0)
RDW: 13.9 % (ref 11.5–15.5)
WBC: 7.1 10*3/uL (ref 4.0–10.5)

## 2011-08-21 LAB — POCT I-STAT 3, ART BLOOD GAS (G3+)
Acid-base deficit: 2 mmol/L (ref 0.0–2.0)
pH, Arterial: 7.388 (ref 7.350–7.450)

## 2011-08-21 LAB — BASIC METABOLIC PANEL
BUN: 23 mg/dL (ref 6–23)
Chloride: 100 mEq/L (ref 96–112)
GFR calc Af Amer: 55 mL/min — ABNORMAL LOW (ref >90)
Potassium: 4.6 mEq/L (ref 3.5–5.1)

## 2011-08-21 LAB — POCT I-STAT 3, VENOUS BLOOD GAS (G3P V)
O2 Saturation: 52 %
O2 Saturation: 52 %
pCO2, Ven: 44.9 mmHg — ABNORMAL LOW (ref 45.0–50.0)
pCO2, Ven: 46.4 mmHg (ref 45.0–50.0)

## 2011-08-21 LAB — HEPARIN LEVEL (UNFRACTIONATED): Heparin Unfractionated: 0.66 IU/mL (ref 0.30–0.70)

## 2011-08-21 LAB — POCT ACTIVATED CLOTTING TIME: Activated Clotting Time: 133 seconds

## 2011-08-21 SURGERY — LEFT AND RIGHT HEART CATHETERIZATION WITH CORONARY ANGIOGRAM
Anesthesia: LOCAL

## 2011-08-21 MED ORDER — HEPARIN (PORCINE) IN NACL 100-0.45 UNIT/ML-% IJ SOLN
1100.0000 [IU]/h | INTRAMUSCULAR | Status: DC
  Administered 2011-08-22: 1100 [IU]/h via INTRAVENOUS
  Filled 2011-08-21 (×2): qty 250

## 2011-08-21 MED ORDER — LIDOCAINE HCL (PF) 1 % IJ SOLN
INTRAMUSCULAR | Status: AC
  Filled 2011-08-21: qty 30

## 2011-08-21 MED ORDER — FUROSEMIDE 10 MG/ML IJ SOLN
INTRAMUSCULAR | Status: AC
  Filled 2011-08-21: qty 4

## 2011-08-21 MED ORDER — CARVEDILOL 6.25 MG PO TABS
6.2500 mg | ORAL_TABLET | Freq: Two times a day (BID) | ORAL | Status: DC
  Administered 2011-08-21 – 2011-08-24 (×7): 6.25 mg via ORAL
  Filled 2011-08-21 (×10): qty 1

## 2011-08-21 MED ORDER — NITROGLYCERIN 0.2 MG/ML ON CALL CATH LAB
INTRAVENOUS | Status: AC
  Filled 2011-08-21: qty 1

## 2011-08-21 MED ORDER — HEPARIN (PORCINE) IN NACL 2-0.9 UNIT/ML-% IJ SOLN
INTRAMUSCULAR | Status: AC
  Filled 2011-08-21: qty 2000

## 2011-08-21 MED ORDER — SODIUM CHLORIDE 0.9 % IV SOLN: INTRAVENOUS | Status: AC

## 2011-08-21 NOTE — Progress Notes (Signed)
CARDIOTHORACIC SURGERY PROGRESS NOTE  Day of Surgery  S/P Procedure(s) (LRB): LEFT AND RIGHT HEART CATHETERIZATION WITH CORONARY ANGIOGRAM (N/A)  Subjective: No complaints.  Objective: Vital signs in last 24 hours: Temp:  [98 F (36.7 C)-99.1 F (37.3 C)] 98 F (36.7 C) (05/13 1700) Pulse Rate:  [50-110] 58  (05/13 1800) Cardiac Rhythm:  [-] Atrial fibrillation (05/13 1700) Resp:  [16-20] 18  (05/13 1700) BP: (119-162)/(62-119) 151/113 mmHg (05/13 1745) SpO2:  [92 %-98 %] 98 % (05/13 1800) Weight:  [83.6 kg (184 lb 4.9 oz)] 83.6 kg (184 lb 4.9 oz) (05/13 0500)  Physical Exam:  Rhythm:   Afib  Breath sounds: clear  Heart sounds:  Irregular with III/VI holosystolic murmur  Incisions:  n/a  Abdomen:  soft  Extremities:  warm   Intake/Output from previous day: 05/12 0701 - 05/13 0700 In: 1311.3 [P.O.:560; I.V.:751.3] Out: 1260 [Urine:1260] Intake/Output this shift: Total I/O In: 1512.1 [P.O.:300; I.V.:1212.1] Out: 900 [Urine:900]  Lab Results:  Basename 08/21/11 0531 08/20/11 0533  WBC 7.1 7.0  HGB 12.6* 13.0  HCT 36.9* 37.6*  PLT 180 183   BMET:  Basename 08/21/11 0531 08/20/11 0533  NA 137 139  K 4.6 4.2  CL 100 103  CO2 28 25  GLUCOSE 116* 137*  BUN 23 28*  CREATININE 1.39* 1.33  CALCIUM 9.7 9.9    CBG (last 3)  No results found for this basename: GLUCAP:3 in the last 72 hours PT/INR:   [facility] 08/19/11 0535  LABPROT 14.8  INR 1.14    Cardiac Catheterization Procedure Note  Name: [name]  MRN: [id number]  DOB: Oct 25, 1931  Procedure: Right Heart Cath, Placement of catheters for angio without left heart cath, Selective Coronary Angiography, SVG angio, LIMA angio, distal aortography.  Indication: severe mitral regurgitation in the setting of prior AVR and CABG.  Procedural Details: The left groin was prepped, draped, and anesthetized with 1% lidocaine. Using the modified Seldinger technique a 5 French sheath was placed in the left femoral  artery and a 7 French sheath was placed in the right femoral vein. A Swan-Ganz catheter was used for the right heart catheterization. Standard protocol was followed for recording of right heart pressures and sampling of oxygen saturations. Fick cardiac output was calculated. Standard Judkins catheters were used for selective coronary angiography, SVG and LIMA angiography. A distal aortic shot was obtained despite the creatinine after I spoke with Dr. Barry Weiss. We limited contrast to 30cc. There were no immediate procedural complications. The patient was transferred to the post catheterization recovery area for further monitoring. Because his LVEDP was very high, he was given 20mg  IV furosemide.  Procedural Findings:  Hemodynamics  RA 14  RV 67/13  PA 68/36 (51)  PCWP 35-46 (difficult because of rapid rate and atrial fib)  LV not done  AO 161/107 (128)  Oxygen saturations:  SVC 52%  PA 52%  AO 98%  Cardiac Output (Fick) 3.43 L/min  Cardiac Index (Fick) 1.69 L/min/M2  Cardiac Output (thermo) 3.53 L/min  Cardiac Index (thermo) 1.74 L/min/m2  Coronary angiography:  Coronary dominance: left  Left mainstem: No significant obstruction  Left anterior descending (LAD): Calcified with irregularity proximally. There is 30-40% non obstructive narrowing at the takeoff of the large diagonal, which is without critical disease. After this, the LAD is severely diseased with diffuse 80% narrowing. There is competitive filling distally.  Left circumflex (LCx): the OM1 has slow flow and the circumflex distal to this is occluded  Right coronary  artery (RCA): Severely diseased and occluded. It appears to be non dominant  The LIMA to the LAD is widely patent. The distal LAD appears without critical disease.  The SVG to the PDA (arising from the left circulation) is widely patent.  The SVG to the OM remains widely patent.  The distal aortic shot is consistent with low cardiac output. Anatomic structures are hard to  define accurately, but the distal aorto leading into the left iliac and left common femoral appears widely open.  Left ventriculography: Left ventricular systolic function is normal, LVEF is estimated at 55-65%, there is no significant mitral regurgitation  Final Conclusions:  1. Severe mitral regurgitation by echo  2. Pulmonary hypertension secondary to #1  3. Poorly controlled rate of atrial fibrillation  4. Widely patent grafts from prior surgery.  Recommendations:  1. Improve rate control  2. Further plans per Dr. Lind Weiss   Assessment/Plan:  Cath results reviewed.  I did not see that an LV-gram was done, which is entirely appropriate under the circumstances.  I suspect that the report above is incorrect by stating:  "Left ventriculography: Left ventricular systolic function is normal, LVEF is estimated at 55-65%, there is no significant mitral regurgitation"  We tentatively plan minimally-invasive mitral valve repair + maze on Friday.   [name] 08/21/2011 6:19 PM

## 2011-08-21 NOTE — Progress Notes (Signed)
EKG done  rt groin prepped as ordered.

## 2011-08-21 NOTE — CV Procedure (Addendum)
   Cardiac Catheterization Procedure Note  Name: [name] MRN: [id number] DOB: 1931-10-22  Procedure: Right Heart Cath, Placement of catheters for angio without left heart cath, Selective Coronary Angiography, SVG angio, LIMA angio, distal aortography.    Indication: severe mitral regurgitation in the setting of prior AVR and CABG.     Procedural Details: The left groin was prepped, draped, and anesthetized with 1% lidocaine. Using the modified Seldinger technique a 5 French sheath was placed in the left femoral artery and a 7 French sheath was placed in the right femoral vein. A Swan-Ganz catheter was used for the right heart catheterization. Standard protocol was followed for recording of right heart pressures and sampling of oxygen saturations. Fick cardiac output was calculated. Standard Judkins catheters were used for selective coronary angiography, SVG and LIMA angiography.  A distal aortic shot was obtained despite the creatinine after I spoke with Dr. Barry Dienes.  We limited contrast to 30cc. There were no immediate procedural complications. The patient was transferred to the post catheterization recovery area for further monitoring.  Because his LVEDP was very high, he was given 20mg  IV furosemide.    Procedural Findings: Hemodynamics RA 14 RV 67/13 PA 68/36 (51) PCWP 35-46  (difficult because of rapid rate and atrial fib) LV not done AO 161/107 (128)  Oxygen saturations: SVC 52% PA 52% AO 98%  Cardiac Output (Fick) 3.43 L/min  Cardiac Index (Fick) 1.69 L/min/M2 Cardiac Output (thermo)  3.53 L/min Cardiac Index (thermo) 1.74 L/min/m2   Coronary angiography: Coronary dominance: left  Left mainstem: No significant obstruction  Left anterior descending (LAD): Calcified with irregularity proximally.  There is 30-40% non obstructive narrowing at the takeoff of the large diagonal, which is without critical disease. After this, the LAD is severely diseased with diffuse 80%  narrowing.  There is competitive filling distally.     Left circumflex (LCx): the OM1 has slow flow and the circumflex distal to this is occluded  Right coronary artery (RCA): Severely diseased and occluded.  It appears to be non dominant  The LIMA to the LAD is widely patent. The distal LAD appears without critical disease.  The SVG to the PDA (arising from the left circulation) is widely patent.  The SVG to the OM remains widely patent.  The distal aortic shot is consistent with low cardiac output.  Anatomic structures are hard to define accurately, but the distal aorto leading into the left iliac and left common femoral appears widely open.     Left ventriculography: Not done.  Prosthetic AV.  Prior echo data.    Final Conclusions:   1.  Severe mitral regurgitation by echo 2.  Pulmonary hypertension secondary to #1 3.  Poorly controlled rate of atrial fibrillation 4.  Widely patent grafts from prior surgery.  Recommendations:   1.  Improve rate control 2.  Further plans per Dr. Fleet Contras [facility] 08/21/2011, 4:36 PM

## 2011-08-21 NOTE — Progress Notes (Signed)
ANTICOAGULATION CONSULT NOTE - Follow Up Consult  Pharmacy Consult for Heparin Indication: atrial fibrillation  No Known Allergies  Vital Signs: Temp: 98 F (36.7 C) (05/13 1700) Temp src: Oral (05/13 1700) BP: 131/88 mmHg (05/13 1711) Pulse Rate: 110  (05/13 1446)  Labs:  Basename 08/21/11 0531 08/20/11 0533 08/19/11 0535  HGB 12.6* 13.0 --  HCT 36.9* 37.6* 39.6  PLT 180 183 195  APTT -- -- 103*  LABPROT -- -- 14.8  INR -- -- 1.14  HEPARINUNFRC 0.66 0.43 0.45  CREATININE 1.39* 1.33 1.54*  CKTOTAL -- -- --  CKMB -- -- --  TROPONINI -- -- --    Estimated Creatinine Clearance: 48.8 ml/min (by C-G formula based on Cr of 1.39).    Assessment: 79yom continues on heparin for afib. He is s/p cath and orders to resume heparin 8hrs post-sheath removal (removed at 16:20).  Cath revealed patent grafts but pulmonary HTN d/t severe MR. Heparin level has been therapeutic but at upper limit goal this am. Nobleeding per chart notes. CBC stable, sCr improving.  Goal of Therapy:  Heparin level 0.3-0.7 units/ml Monitor platelets by anticoagulation protocol: Yes   Plan:  1)  At 00:30, resume heparin at 1100 units/hr and check 8h heparin level  Dannielle Huh 08/21/2011,5:53 PM

## 2011-08-21 NOTE — H&P (View-Only) (Signed)
   SUBJECTIVE:  No chest pain.  One spell of SOB this am but feels well now.     PHYSICAL EXAM Filed Vitals:   08/19/11 1533 08/20/11 0000 08/20/11 0423 08/20/11 0500  BP:  120/76 134/77   Pulse:      Temp: 98.3 F (36.8 C) 98.7 F (37.1 C) 98.4 F (36.9 C)   TempSrc: Oral Oral Oral   Resp:  16 18   Height:      Weight:    83.2 kg (183 lb 6.8 oz)  SpO2:   93%    General:  No distress HEENT: PERRL Lungs:  Few basilar crackles with decreased breath sounds on the right. Heart:  Irregular Abdomen:  Positive bowel sounds, no rebound no guarding. Extremities:  No edema Neuro:  Intact, nonfocal  LABS: No results found for this basename: CKTOTAL,  CKMB,  CKMBINDEX,  TROPONINI   Results for orders placed during the hospital encounter of 08/17/11 (from the past 24 hour(s))  HEPARIN LEVEL (UNFRACTIONATED)     Status: Normal   Collection Time   08/20/11  5:33 AM      Component Value Range   Heparin Unfractionated 0.43  0.30 - 0.70 (IU/mL)  CBC     Status: Abnormal   Collection Time   08/20/11  5:33 AM      Component Value Range   WBC 7.0  4.0 - 10.5 (K/uL)   RBC 4.19 (*) 4.22 - 5.81 (MIL/uL)   Hemoglobin 13.0  13.0 - 17.0 (g/dL)   HCT 37.6 (*) 39.0 - 52.0 (%)   MCV 89.7  78.0 - 100.0 (fL)   MCH 31.0  26.0 - 34.0 (pg)   MCHC 34.6  30.0 - 36.0 (g/dL)   RDW 13.6  11.5 - 15.5 (%)   Platelets 183  150 - 400 (K/uL)  BASIC METABOLIC PANEL     Status: Abnormal   Collection Time   08/20/11  5:33 AM      Component Value Range   Sodium 139  135 - 145 (mEq/L)   Potassium 4.2  3.5 - 5.1 (mEq/L)   Chloride 103  96 - 112 (mEq/L)   CO2 25  19 - 32 (mEq/L)   Glucose, Bld 137 (*) 70 - 99 (mg/dL)   BUN 28 (*) 6 - 23 (mg/dL)   Creatinine, Ser 1.33  0.50 - 1.35 (mg/dL)   Calcium 9.9  8.4 - 10.5 (mg/dL)   GFR calc non Af Amer 50 (*) >90 (mL/min)   GFR calc Af Amer 58 (*) >90 (mL/min)    Intake/Output Summary (Last 24 hours) at 08/20/11 0837 Last data filed at 08/20/11 0500  Gross per 24  hour  Intake  481.5 ml  Output   1400 ml  Net -918.5 ml    ASSESSMENT AND PLAN:  1. Acute on chronic systolic CHF: Episode of SOB this am.  I will repeat a portable CXR.  2. CAD: Elevated troponin in setting of #1. Cath Monday.  3. Severe MR: S/p TEE. Pending R&L heart cath as above. (See Dr. Owen's note.)  4. Acute on Chronic stage III Kidney Dzs: Creatinine is improved.  Cath in am.    5. A.Fib: Chronic. Relatively rate controlled. Cont heparin.  Early Steel 08/20/2011 8:37 AM     

## 2011-08-21 NOTE — Progress Notes (Signed)
ANTICOAGULATION CONSULT NOTE - Follow Up Consult  Pharmacy Consult for Heparin Indication: atrial fibrillation  No Known Allergies  Vital Signs: Temp: 98 F (36.7 C) (05/13 0745) Temp src: Oral (05/13 0745) BP: 135/84 mmHg (05/13 0745) Pulse Rate: 91  (05/13 0745)  Labs:  Basename 08/21/11 0531 08/20/11 0533 08/19/11 0535  HGB 12.6* 13.0 --  HCT 36.9* 37.6* 39.6  PLT 180 183 195  APTT -- -- 103*  LABPROT -- -- 14.8  INR -- -- 1.14  HEPARINUNFRC 0.66 0.43 0.45  CREATININE 1.39* 1.33 1.54*  CKTOTAL -- -- --  CKMB -- -- --  TROPONINI -- -- --    Estimated Creatinine Clearance: 48.8 ml/min (by C-G formula based on Cr of 1.39).  Medications:  Heparin @ 1150 units/hr  Assessment: 79yom continues on heparin for afib awaiting cath today to further assess severe mitral regurgitation. Heparin level is therapeutic. No bleeding per chart notes. CBC stable, sCr improving.  Goal of Therapy:  Heparin level 0.3-0.7 units/ml Monitor platelets by anticoagulation protocol: Yes   Plan:  1) Continue heparin at 1150 units/hr 2) Follow up after cath today  Fredrik Rigger 08/21/2011,9:07 AM

## 2011-08-21 NOTE — Interval H&P Note (Signed)
History and Physical Interval Note:  08/21/2011 3:15 PM  [name]  has presented today for surgery, with the diagnosis of cp  The various methods of treatment have been discussed with the patient and family. After consideration of risks, benefits and other options for treatment, the patient has consented to  Procedure(s) (LRB): LEFT AND RIGHT HEART CATHETERIZATION WITH CORONARY ANGIOGRAM (N/A) as a surgical intervention .  The patients' history has been reviewed, patient examined, no change in status, stable for surgery.  I have reviewed the patients' chart and labs.  Questions were answered to the patient's satisfaction.     Shawnie Pons

## 2011-08-21 NOTE — Care Management Note (Addendum)
    Page 1 of 2   09/04/2011     10:24:56 AM   CARE MANAGEMENT NOTE 09/04/2011  Patient:  [name], [name]   Account Number:  [id number]  Date Initiated:  08/21/2011  Documentation initiated by:  Junius Creamer  Subjective/Objective Assessment:   adm w chf     Action/Plan:   lives w son, pcp dr guy degent   Anticipated DC Date:  09/04/2011   Anticipated DC Plan:  HOME W HOME HEALTH SERVICES      DC Planning Services  CM consult      [facility] Choice  HOME HEALTH   Choice offered to / List presented to:  C-1 Patient   DME arranged  [facility]      DME agency  [facility]     HH arranged  HH-1 RN  HH-2 PT      [facility] agency  [facility]   Status of service:  Completed, signed off Medicare Important Message given?   (If response is "NO", the following Medicare IM given date fields will be blank) Date Medicare IM given:   Date Additional Medicare IM given:    Discharge Disposition:  HOME W HOME HEALTH SERVICES  Per UR Regulation:  Reviewed for med. necessity/level of care/duration of stay  If discussed at Long Length of Stay Meetings, dates discussed:   08/23/2011    Comments:  09/04/11   1020  Harry Mihalik SIMMONS RN, BSN   [phone] RECEIVED REFERRAL FOR HHRN- PT/INR TO BE DRAWN ON 09/06/11 AND HHRN;  REFERRAL MADE TO [facility] WITH [facility] FOR HOME O2; REFERRAL ALSO MADE TO [facility] AS [facility] WAS UNABLE TO SERVICE [facility] AND PT'S OTHER CHOICE;  INFORMATION FAXED AND SOC DATE :  WED 09/06/11.  PT READY FOR DISCHARGE.      08/29/11  1502  Harry Stanko SIMMONS  RN, BSN [phone] DISCUSSED DISCHARGE PLANNING WITH PT AND SON AT BEDSIDE; PT RECS HHPT; PT AND SON CHOSE [facility] FOR SERVICES; PLEASE ORDER HHRN, HHPT;  PT ALREADY HAS DME AT HOME.  NCM WILL CONTINUE TO FOLLOW.   5/15 15:00 Harry dowell rn,bsn spoke w pt. he lives w son. indep. not sure of dc needs. awaits mitral valve rep on 5/17.  08/21/11 14:07p Harry dowell  rn,bsn [phone]

## 2011-08-22 DIAGNOSIS — N189 Chronic kidney disease, unspecified: Secondary | ICD-10-CM

## 2011-08-22 DIAGNOSIS — I059 Rheumatic mitral valve disease, unspecified: Principal | ICD-10-CM

## 2011-08-22 LAB — CBC
MCH: 30.9 pg (ref 26.0–34.0)
MCHC: 33.6 g/dL (ref 30.0–36.0)
MCV: 92 fL (ref 78.0–100.0)
Platelets: 186 10*3/uL (ref 150–400)
RBC: 3.98 MIL/uL — ABNORMAL LOW (ref 4.22–5.81)
RDW: 13.6 % (ref 11.5–15.5)

## 2011-08-22 LAB — URINALYSIS, ROUTINE W REFLEX MICROSCOPIC
Glucose, UA: NEGATIVE mg/dL
Ketones, ur: NEGATIVE mg/dL
Leukocytes, UA: NEGATIVE
Protein, ur: NEGATIVE mg/dL
Urobilinogen, UA: 1 mg/dL (ref 0.0–1.0)

## 2011-08-22 LAB — BASIC METABOLIC PANEL
BUN: 25 mg/dL — ABNORMAL HIGH (ref 6–23)
Calcium: 10.1 mg/dL (ref 8.4–10.5)
Chloride: 99 mEq/L (ref 96–112)
Creatinine, Ser: 1.36 mg/dL — ABNORMAL HIGH (ref 0.50–1.35)
GFR calc Af Amer: 56 mL/min — ABNORMAL LOW (ref >90)
GFR calc non Af Amer: 49 mL/min — ABNORMAL LOW (ref >90)

## 2011-08-22 LAB — HEPARIN LEVEL (UNFRACTIONATED)
Heparin Unfractionated: 0.22 IU/mL — ABNORMAL LOW (ref 0.30–0.70)
Heparin Unfractionated: 0.18 IU/mL — ABNORMAL LOW (ref 0.30–0.70)

## 2011-08-22 MED ORDER — FUROSEMIDE 10 MG/ML IJ SOLN
20.0000 mg | Freq: Two times a day (BID) | INTRAMUSCULAR | Status: DC
  Administered 2011-08-22 – 2011-08-24 (×6): 20 mg via INTRAVENOUS
  Filled 2011-08-22 (×9): qty 2

## 2011-08-22 MED ORDER — SODIUM CHLORIDE 0.9 % IV SOLN
INTRAVENOUS | Status: DC | PRN
  Administered 2011-08-22: 01:00:00 via INTRAVENOUS

## 2011-08-22 MED ORDER — HEPARIN (PORCINE) IN NACL 100-0.45 UNIT/ML-% IJ SOLN: 1300.0000 [IU]/h | INTRAMUSCULAR | Status: DC

## 2011-08-22 MED ORDER — HEPARIN (PORCINE) IN NACL 100-0.45 UNIT/ML-% IJ SOLN
1550.0000 [IU]/h | INTRAMUSCULAR | Status: DC
  Administered 2011-08-23: 1550 [IU]/h via INTRAVENOUS
  Filled 2011-08-22: qty 250

## 2011-08-22 NOTE — Progress Notes (Signed)
ANTICOAGULATION CONSULT NOTE - Follow Up Consult  Pharmacy Consult for Heparin Indication: atrial fibrillation  No Known Allergies  Vital Signs: Temp: 97.9 F (36.6 C) (05/14 0802) Temp src: Oral (05/14 0802) BP: 119/88 mmHg (05/14 0905) Pulse Rate: 96  (05/14 0905)  Labs:  Basename 08/22/11 0841 08/22/11 0500 08/21/11 0531 08/20/11 0533  HGB -- 12.3* 12.6* --  HCT -- 36.6* 36.9* 37.6*  PLT -- 186 180 183  APTT -- -- -- --  LABPROT -- -- -- --  INR -- -- -- --  HEPARINUNFRC 0.22* -- 0.66 0.43  CREATININE 1.36* -- 1.39* 1.33  CKTOTAL -- -- -- --  CKMB -- -- -- --  TROPONINI -- -- -- --    Estimated Creatinine Clearance: 49.9 ml/min (by C-G formula based on Cr of 1.36).  Medications:  Heparin @ 1100 units/hr  Assessment: 79yom resumed heparin s/p cath for findings of severe mitral regurgitation. Plan is for MVR + maze procedure on Friday 5/17. Initial heparin level after resuming heparin is subtherapeutic. No bleeding noted. CBC stable.  Goal of Therapy:  Heparin level 0.3-0.7 units/ml Monitor platelets by anticoagulation protocol: Yes   Plan:  1) Increase heparin to 1300 units/hr 2) 8 hour heparin level  Fredrik Rigger 08/22/2011,10:50 AM

## 2011-08-22 NOTE — Progress Notes (Signed)
ANTICOAGULATION CONSULT NOTE - Follow Up Consult  Pharmacy Consult for Heparin Indication: atrial fibrillation  No Known Allergies  Vital Signs: Temp: 99 F (37.2 C) (05/14 1935) Temp src: Oral (05/14 1935) BP: 111/76 mmHg (05/14 1935) Pulse Rate: 87  (05/14 1137)  Labs:  Basename 08/22/11 1950 08/22/11 0841 08/22/11 0500 08/21/11 0531 08/20/11 0533  HGB -- -- 12.3* 12.6* --  HCT -- -- 36.6* 36.9* 37.6*  PLT -- -- 186 180 183  APTT -- -- -- -- --  LABPROT -- -- -- -- --  INR -- -- -- -- --  HEPARINUNFRC 0.18* 0.22* -- 0.66 --  CREATININE -- 1.36* -- 1.39* 1.33  CKTOTAL -- -- -- -- --  CKMB -- -- -- -- --  TROPONINI -- -- -- -- --    Estimated Creatinine Clearance: 49.9 ml/min (by C-G formula based on Cr of 1.36).  Medications:  Heparin @ 1100 units/hr  Assessment: 79yom resumed heparin s/p cath for findings of severe mitral regurgitation. Plan is for MVR + maze procedure on Friday 5/17. Heparin level tonight subtherapeutic. Will adjust rate  Goal of Therapy:  Heparin level 0.3-0.7 units/ml Monitor platelets by anticoagulation protocol: Yes   Plan:  1) Increase heparin to 1550 units/hr 2) F/u with AM heparin level  Ulyses Southward Jones 08/22/2011,8:35 PM

## 2011-08-22 NOTE — Progress Notes (Signed)
Subjective:  Seems improved.  Did have some shortness of breath last night.  Cath report amended  (re:LV).  Now better.  Surgery schedule for Friday.    Objective:  Vital Signs in the last 24 hours: Temp:  [97.4 F (36.3 C)-99 F (37.2 C)] 97.9 F (36.6 C) (05/14 0802) Pulse Rate:  [49-110] 91  (05/14 0802) Resp:  [12-18] 16  (05/14 0802) BP: (121-163)/(76-119) 132/86 mmHg (05/14 0725) SpO2:  [90 %-98 %] 97 % (05/14 0802) Weight:  [185 lb 3 oz (84 kg)] 185 lb 3 oz (84 kg) (05/14 0400)  Intake/Output from previous day: 05/13 0701 - 05/14 0700 In: 2184.9 [P.O.:780; I.V.:1404.9] Out: 2125 [Urine:2125]   Physical Exam: General: thin, older gentleman in no acute distress.   Head:  Normocephalic and atraumatic. Lungs: crackles in right base Heart: irregularly, irregular with loud apical murmur consistent with MR.   Pulses: Pulses normal in all 4 extremities. Extremities: No clubbing or cyanosis. No edema.  Groin site is excellent.   Neurologic: Alert and oriented x 3.    Lab Results:  Basename 08/22/11 0500 08/21/11 0531  WBC 8.2 7.1  HGB 12.3* 12.6*  PLT 186 180    Basename 08/21/11 0531 08/20/11 0533  NA 137 139  K 4.6 4.2  CL 100 103  CO2 28 25  GLUCOSE 116* 137*  BUN 23 28*  CREATININE 1.39* 1.33   No results found for this basename: TROPONINI:2,CK,MB:2 in the last 72 hours Hepatic Function Panel No results found for this basename: PROT,ALBUMIN,AST,ALT,ALKPHOS,BILITOT,BILIDIR,IBILI in the last 72 hours No results found for this basename: CHOL in the last 72 hours No results found for this basename: PROTIME in the last 72 hours  Imaging: Dg Chest Port 1 View  08/20/2011  *RADIOLOGY REPORT*  Clinical Data: Panic attack.  Dizzy spells.  Short of breath.  PORTABLE CHEST - 1 VIEW  Comparison: 08/19/2011.  Findings: Hazy opacity over the right chest is favored to be technical and artifactual.  Cardiomegaly is present.  Median sternotomy and CABG.  No definite interval  change from yesterday's examination.  IMPRESSION: No definite interval change.  Hazy opacity of the right hemithorax is probably technical.  Original Report Authenticated By: Andreas Newport, M.D.      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    Plan: continue current management.  Carvedilol was increased last pm.  Chronic kidney disease (CKD) (08/18/2011)   Assessment: BMET   Plan: monitor Severe mitral regurgitation (08/18/2011)   Assessment: see Dr. Andee Lineman echo report   Plan: plan is for MVR on Friday with MAZE--see Cornelius Moras report.         Shawnie Pons, MD, [facility], FSC[facility] 08/22/2011, 8:36 AM

## 2011-08-22 NOTE — Progress Notes (Signed)
   CARDIOTHORACIC SURGERY PROGRESS NOTE  1 Day Post-Op  S/P Procedure(s) (LRB): LEFT AND RIGHT HEART CATHETERIZATION WITH CORONARY ANGIOGRAM (N/A)  Subjective: No complaints.  One brief episode resting SOB earlier, feels fine now.  Objective: Vital signs in last 24 hours: Temp:  [97.4 F (36.3 C)-99 F (37.2 C)] 97.8 F (36.6 C) (05/14 1618) Pulse Rate:  [49-108] 87  (05/14 1137) Cardiac Rhythm:  [-] Atrial fibrillation (05/14 1618) Resp:  [12-16] 16  (05/14 1618) BP: (108-163)/(61-113) 132/84 mmHg (05/14 1618) SpO2:  [90 %-98 %] 94 % (05/14 1618) Weight:  [84 kg (185 lb 3 oz)] 84 kg (185 lb 3 oz) (05/14 0400)  Physical Exam:  Rhythm:   Afib  Breath sounds: Few bibasilar rales  Heart sounds:  irreg with murmur  Incisions:  n/a  Abdomen:  soft  Extremities:  warm   Intake/Output from previous day: 05/13 0701 - 05/14 0700 In: 2184.9 [P.O.:780; I.V.:1404.9] Out: 2125 [Urine:2125] Intake/Output this shift: Total I/O In: 754 [P.O.:480; I.V.:274] Out: 850 [Urine:850]  Lab Results:  Basename 08/22/11 0500 08/21/11 0531  WBC 8.2 7.1  HGB 12.3* 12.6*  HCT 36.6* 36.9*  PLT 186 180   BMET:  Basename 08/22/11 0841 08/21/11 0531  NA 137 137  K 4.9 4.6  CL 99 100  CO2 27 28  GLUCOSE 116* 116*  BUN 25* 23  CREATININE 1.36* 1.39*  CALCIUM 10.1 9.7    CBG (last 3)  No results found for this basename: GLUCAP:3 in the last 72 hours PT/INR:  No results found for this basename: LABPROT,INR in the last 72 hours  CXR:  N/A  Assessment/Plan: S/P Procedure(s) (LRB): LEFT AND RIGHT HEART CATHETERIZATION WITH CORONARY ANGIOGRAM (N/A)  Clinically stable  For mitral repair + maze on Friday  Jacquie Lukes H 08/22/2011 5:35 PM

## 2011-08-23 LAB — BASIC METABOLIC PANEL
CO2: 31 mEq/L (ref 19–32)
Calcium: 9.7 mg/dL (ref 8.4–10.5)
Creatinine, Ser: 1.56 mg/dL — ABNORMAL HIGH (ref 0.50–1.35)
GFR calc non Af Amer: 41 mL/min — ABNORMAL LOW (ref >90)
Sodium: 138 mEq/L (ref 135–145)

## 2011-08-23 LAB — HEPARIN LEVEL (UNFRACTIONATED)
Heparin Unfractionated: 0.64 IU/mL (ref 0.30–0.70)
Heparin Unfractionated: 0.84 IU/mL — ABNORMAL HIGH (ref 0.30–0.70)

## 2011-08-23 LAB — CBC
MCH: 31.3 pg (ref 26.0–34.0)
MCHC: 34.7 g/dL (ref 30.0–36.0)
Platelets: 184 10*3/uL (ref 150–400)
RDW: 13.5 % (ref 11.5–15.5)

## 2011-08-23 MED ORDER — HEPARIN (PORCINE) IN NACL 100-0.45 UNIT/ML-% IJ SOLN
1200.0000 [IU]/h | INTRAMUSCULAR | Status: AC
  Filled 2011-08-23 (×3): qty 250

## 2011-08-23 NOTE — Progress Notes (Signed)
ANTICOAGULATION CONSULT NOTE - Follow Up Consult  Pharmacy Consult for Heparin Indication: atrial fibrillation  No Known Allergies  Patient Measurements: Height: 6' (182.9 cm) Weight: 184 lb 1.4 oz (83.5 kg) IBW/kg (Calculated) : 77.6  Heparin Dosing Weight: 83.5 kg  Vital Signs: Temp: 98.1 F (36.7 C) (05/15 2014) Temp src: Oral (05/15 2014) BP: 127/74 mmHg (05/15 2014) Pulse Rate: 84  (05/15 1158)  Labs:  Basename 08/23/11 2024 08/23/11 1146 08/23/11 0523 08/22/11 0841 08/22/11 0500 08/21/11 0531  HGB -- -- 12.4* -- 12.3* --  HCT -- -- 35.7* -- 36.6* 36.9*  PLT -- -- 184 -- 186 180  APTT -- -- -- -- -- --  LABPROT -- -- -- -- -- --  INR -- -- -- -- -- --  HEPARINUNFRC 0.73* 0.84* 0.64 -- -- --  CREATININE -- -- 1.56* 1.36* -- 1.39*  CKTOTAL -- -- -- -- -- --  CKMB -- -- -- -- -- --  TROPONINI -- -- -- -- -- --    Estimated Creatinine Clearance: 43.5 ml/min (by C-G formula based on Cr of 1.56).  Assessment: Heparin level is still slightly supratherapeutic on 1400 units/hr.  Rate changed but he is still not clearing as well as expected.  No noted bleeding complications.  Requested to keep level 0.3-0.5 per Dr. Riley Kill.  For minimally invasive mitral valve repair and maze on 5/17.  Goal of Therapy:  Heparin level 0.3-0.5 units/ml Monitor platelets by anticoagulation protocol: Yes   Plan:     Decrease heparin drip to 1250 units/hr.    Next heparin level in ~6-8 hours with AM labs.    Continue daily heparin level and CBC.  Nadara Mustard, PharmD., MS Clinical Pharmacist Pager:  [phone] Thank you for allowing pharmacy to be part of this patients care team. 08/23/2011,9:31 PM

## 2011-08-23 NOTE — Progress Notes (Signed)
ANTICOAGULATION CONSULT NOTE - Follow Up Consult  Pharmacy Consult for heparin Indication: atrial fibrillation  Labs:  Basename 08/23/11 0523 08/22/11 1950 08/22/11 0841 08/22/11 0500 08/21/11 0531  HGB 12.4* -- -- 12.3* --  HCT 35.7* -- -- 36.6* 36.9*  PLT 184 -- -- 186 180  APTT -- -- -- -- --  LABPROT -- -- -- -- --  INR -- -- -- -- --  HEPARINUNFRC 0.64 0.18* 0.22* -- --  CREATININE -- -- 1.36* -- 1.39*  CKTOTAL -- -- -- -- --  CKMB -- -- -- -- --  TROPONINI -- -- -- -- --    Assessment/Plan: 78yo male now therapeutic on heparin after rate increases.  Will continue gtt at current rate and confirm stable with additional level.  Colleen Can PharmD BCPS 08/23/2011,6:16 AM

## 2011-08-23 NOTE — Progress Notes (Signed)
Subjective:  Continues to remain pretty stable.  One brief episode of SOB yesterday.  No chest pain. I/O are negative.    Objective:  Vital Signs in the last 24 hours: Temp:  [97.8 F (36.6 C)-99 F (37.2 C)] 98.2 F (36.8 C) (05/15 0736) Pulse Rate:  [87-89] 89  (05/15 0736) Resp:  [16-17] 17  (05/15 0736) BP: (100-134)/(61-84) 134/68 mmHg (05/15 0736) SpO2:  [93 %-97 %] 94 % (05/15 0736) Weight:  [184 lb 1.4 oz (83.5 kg)] 184 lb 1.4 oz (83.5 kg) (05/15 0327)  Intake/Output from previous day: 05/14 0701 - 05/15 0700 In: 1676 [P.O.:1140; I.V.:536] Out: 2350 [Urine:2350]   Physical Exam: General: Well developed, well nourished, in no acute distress. Head:  Normocephalic and atraumatic. Lungs: Minimal basilar crackles. Heart: Irregularly irregular. PMI displaced.  Loud holosystolic murmur at the apex.   Pulses: Pulses normal in all 4 extremities. Extremities: No clubbing or cyanosis. No edema.  Minimal echymossis at cath site. Neurologic: Alert and oriented x 3.    Lab Results:  Basename 08/23/11 0523 08/22/11 0500  WBC 7.3 8.2  HGB 12.4* 12.3*  PLT 184 186    Basename 08/23/11 0523 08/22/11 0841  NA 138 137  K 4.8 4.9  CL 100 99  CO2 31 27  GLUCOSE 123* 116*  BUN 27* 25*  CREATININE 1.56* 1.36*   No results found for this basename: TROPONINI:2,CK,MB:2 in the last 72 hours Hepatic Function Panel No results found for this basename: PROT,ALBUMIN,AST,ALT,ALKPHOS,BILITOT,BILIDIR,IBILI in the last 72 hours No results found for this basename: CHOL in the last 72 hours No results found for this basename: PROTIME in the last 72 hours  Imaging: No results found.    Assessment/Plan:  Patient Active Hospital Problem List:   S/P aortic valve replacement with bioprosthetic valve (08/15/2007)   Assessment: stable   Plan: no change S/P CABG x 3 (08/15/2007)   Assessment: stable   Plan: no change Atrial fibrillation, persistent (08/18/2011)   Assessment: rate control  improved with higher dose carvedilol   Plan: continue.  MAZE for Friday. Chronic kidney disease (CKD) (08/18/2011)   Assessment: Slight increase CR with diuresis   Plan: continue IV furosemide through tomorrow, then PO Severe mitral regurgitation (08/18/2011)   Assessment: loud murmur on exam   Plan: MVR on Friday       Shawnie Pons, MD, [facility], [facility] 08/23/2011, 9:35 AM

## 2011-08-23 NOTE — Progress Notes (Signed)
ANTICOAGULATION CONSULT NOTE - Follow Up Consult  Pharmacy Consult for Heparin Indication: atrial fibrillation  No Known Allergies  Patient Measurements: Height: 6' (182.9 cm) Weight: 184 lb 1.4 oz (83.5 kg) IBW/kg (Calculated) : 77.6  Heparin Dosing Weight: 83.5 kg  Vital Signs: Temp: 98.8 F (37.1 C) (05/15 1158) Temp src: Oral (05/15 1158) BP: 110/81 mmHg (05/15 1158) Pulse Rate: 84  (05/15 1158)  Labs:  Basename 08/23/11 1146 08/23/11 0523 08/22/11 1950 08/22/11 0841 08/22/11 0500 08/21/11 0531  HGB -- 12.4* -- -- 12.3* --  HCT -- 35.7* -- -- 36.6* 36.9*  PLT -- 184 -- -- 186 180  APTT -- -- -- -- -- --  LABPROT -- -- -- -- -- --  INR -- -- -- -- -- --  HEPARINUNFRC 0.84* 0.64 0.18* -- -- --  CREATININE -- 1.56* -- 1.36* -- 1.39*  CKTOTAL -- -- -- -- -- --  CKMB -- -- -- -- -- --  TROPONINI -- -- -- -- -- --    Estimated Creatinine Clearance: 43.5 ml/min (by C-G formula based on Cr of 1.56).  Assessment:   Heparin level is now supratherapeutic on 1550 units/hr.   Level has increased from 0.64 to 0.84 without rate change.  Requested to keep level 0.3-0.5 per Dr. Riley Kill.  No groin site bleeding, minimal  bruising noted.   For minimally invasive mitral valve repair and maze on 5/17.  Goal of Therapy:  Heparin level 0.3-0.5 units/ml Monitor platelets by anticoagulation protocol: Yes   Plan:    Decrease heparin drip to 1400 units/hr.   Next heparin level in ~6-8 hours.   Continue daily heparin level and CBC.  Dennie Fetters, [place] Pager: [phone] 08/23/2011,12:48 PM

## 2011-08-23 NOTE — Progress Notes (Signed)
TCTS BRIEF PROGRESS NOTE   Clinically stable For OR Friday  Gaege Sangalang H 08/23/2011 12:40 PM

## 2011-08-24 DIAGNOSIS — I059 Rheumatic mitral valve disease, unspecified: Secondary | ICD-10-CM

## 2011-08-24 LAB — CBC
Hemoglobin: 12.5 g/dL — ABNORMAL LOW (ref 13.0–17.0)
MCH: 31 pg (ref 26.0–34.0)
MCHC: 34.2 g/dL (ref 30.0–36.0)
MCV: 90.6 fL (ref 78.0–100.0)
MCV: 90.4 fL (ref 78.0–100.0)
Platelets: 210 10*3/uL (ref 150–400)
RBC: 4.03 MIL/uL — ABNORMAL LOW (ref 4.22–5.81)
RBC: 4.27 MIL/uL (ref 4.22–5.81)
WBC: 7.5 10*3/uL (ref 4.0–10.5)

## 2011-08-24 LAB — COMPREHENSIVE METABOLIC PANEL
Albumin: 3.9 g/dL (ref 3.5–5.2)
BUN: 22 mg/dL (ref 6–23)
Chloride: 100 mEq/L (ref 96–112)
Creatinine, Ser: 1.35 mg/dL (ref 0.50–1.35)
Total Bilirubin: 1.3 mg/dL — ABNORMAL HIGH (ref 0.3–1.2)
Total Protein: 6.9 g/dL (ref 6.0–8.3)

## 2011-08-24 LAB — BASIC METABOLIC PANEL
BUN: 24 mg/dL — ABNORMAL HIGH (ref 6–23)
CO2: 31 mEq/L (ref 19–32)
Calcium: 9.5 mg/dL (ref 8.4–10.5)
Chloride: 98 mEq/L (ref 96–112)
Creatinine, Ser: 1.42 mg/dL — ABNORMAL HIGH (ref 0.50–1.35)

## 2011-08-24 LAB — SURGICAL PCR SCREEN: Staphylococcus aureus: NEGATIVE

## 2011-08-24 MED ORDER — LACTATED RINGERS IV SOLN: INTRAVENOUS | Status: DC

## 2011-08-24 MED ORDER — DOPAMINE-DEXTROSE 3.2-5 MG/ML-% IV SOLN
2.0000 ug/kg/min | INTRAVENOUS | Status: DC
  Filled 2011-08-24: qty 250

## 2011-08-24 MED ORDER — SODIUM BICARBONATE 8.4 % IV SOLN
INTRAVENOUS | Status: DC
  Filled 2011-08-24: qty 2.5

## 2011-08-24 MED ORDER — POTASSIUM CHLORIDE 2 MEQ/ML IV SOLN
80.0000 meq | INTRAVENOUS | Status: DC
  Filled 2011-08-24: qty 40

## 2011-08-24 MED ORDER — FENTANYL CITRATE 0.05 MG/ML IJ SOLN: 50.0000 ug | INTRAMUSCULAR | Status: DC | PRN

## 2011-08-24 MED ORDER — DEXTROSE 5 % IV SOLN
750.0000 mg | INTRAVENOUS | Status: DC
  Filled 2011-08-24: qty 750

## 2011-08-24 MED ORDER — TRANEXAMIC ACID 100 MG/ML IV SOLN
1.5000 mg/kg/h | INTRAVENOUS | Status: AC
  Administered 2011-08-25: 1.5 mg/kg/h via INTRAVENOUS
  Filled 2011-08-24: qty 25

## 2011-08-24 MED ORDER — MIDAZOLAM HCL 2 MG/2ML IJ SOLN: 1.0000 mg | INTRAMUSCULAR | Status: DC | PRN

## 2011-08-24 MED ORDER — VANCOMYCIN HCL 1000 MG IV SOLR
1250.0000 mg | INTRAVENOUS | Status: AC
  Administered 2011-08-25: 1250 mg via INTRAVENOUS
  Filled 2011-08-24: qty 1250

## 2011-08-24 MED ORDER — NITROGLYCERIN IN D5W 200-5 MCG/ML-% IV SOLN
2.0000 ug/min | INTRAVENOUS | Status: AC
  Administered 2011-08-25: 5 ug/min via INTRAVENOUS
  Filled 2011-08-24: qty 250

## 2011-08-24 MED ORDER — EPINEPHRINE HCL 1 MG/ML IJ SOLN
0.5000 ug/min | INTRAVENOUS | Status: AC
  Administered 2011-08-25: 2 ug/min via INTRAVENOUS
  Filled 2011-08-24: qty 4

## 2011-08-24 MED ORDER — TRANEXAMIC ACID (OHS) BOLUS VIA INFUSION
15.0000 mg/kg | INTRAVENOUS | Status: AC
  Administered 2011-08-25: 1231.5 mg via INTRAVENOUS
  Filled 2011-08-24: qty 1232

## 2011-08-24 MED ORDER — PHENYLEPHRINE HCL 10 MG/ML IJ SOLN
30.0000 ug/min | INTRAVENOUS | Status: AC
  Administered 2011-08-25: 25 ug/min via INTRAVENOUS
  Filled 2011-08-24: qty 2

## 2011-08-24 MED ORDER — CHLORHEXIDINE GLUCONATE 4 % EX LIQD
60.0000 mL | Freq: Once | CUTANEOUS | Status: AC
  Administered 2011-08-25: 4 via TOPICAL
  Filled 2011-08-24: qty 60

## 2011-08-24 MED ORDER — MAGNESIUM SULFATE 50 % IJ SOLN
40.0000 meq | INTRAMUSCULAR | Status: DC
  Filled 2011-08-24: qty 10

## 2011-08-24 MED ORDER — BISACODYL 5 MG PO TBEC
5.0000 mg | DELAYED_RELEASE_TABLET | Freq: Once | ORAL | Status: AC
  Administered 2011-08-24: 5 mg via ORAL
  Filled 2011-08-24: qty 1

## 2011-08-24 MED ORDER — METOPROLOL TARTRATE 12.5 MG HALF TABLET
12.5000 mg | ORAL_TABLET | Freq: Once | ORAL | Status: AC
  Administered 2011-08-25: 12.5 mg via ORAL
  Filled 2011-08-24: qty 1

## 2011-08-24 MED ORDER — SODIUM CHLORIDE 0.9 % IV SOLN
0.1000 ug/kg/h | INTRAVENOUS | Status: AC
  Administered 2011-08-25: .2 ug/kg/h via INTRAVENOUS
  Filled 2011-08-24: qty 4

## 2011-08-24 MED ORDER — SODIUM CHLORIDE 0.9 % IV SOLN
INTRAVENOUS | Status: AC
  Administered 2011-08-25: 2.3 [IU]/h via INTRAVENOUS
  Filled 2011-08-24: qty 1

## 2011-08-24 MED ORDER — TEMAZEPAM 15 MG PO CAPS: 15.0000 mg | ORAL_CAPSULE | Freq: Once | ORAL | Status: AC | PRN

## 2011-08-24 MED ORDER — DEXTROSE 5 % IV SOLN
1.5000 g | INTRAVENOUS | Status: AC
  Administered 2011-08-25: 1.5 g via INTRAVENOUS
  Administered 2011-08-25: .75 g via INTRAVENOUS
  Filled 2011-08-24: qty 1.5

## 2011-08-24 MED ORDER — TRANEXAMIC ACID (OHS) PUMP PRIME SOLUTION
2.0000 mg/kg | INTRAVENOUS | Status: DC
  Filled 2011-08-24: qty 1.64

## 2011-08-24 NOTE — Progress Notes (Signed)
   CARDIOTHORACIC SURGERY PROGRESS NOTE  3 Days Post-Op  S/P Procedure(s) (LRB): LEFT AND RIGHT HEART CATHETERIZATION WITH CORONARY ANGIOGRAM (N/A)  Subjective: No complaints  Objective: Vital signs in last 24 hours: Temp:  [98 F (36.7 C)-98.9 F (37.2 C)] 98.7 F (37.1 C) (05/16 1210) Pulse Rate:  [90-93] 90  (05/16 1210) Cardiac Rhythm:  [-] Atrial fibrillation (05/16 1210) Resp:  [16-18] 16  (05/16 1210) BP: (97-139)/(60-87) 117/68 mmHg (05/16 1210) SpO2:  [93 %-96 %] 93 % (05/16 1210) Weight:  [82.1 kg (181 lb)] 82.1 kg (181 lb) (05/16 0407)  Physical Exam:  Rhythm:   afib  Breath sounds: clear  Heart sounds:  irreg w/ murmur  Incisions:  n/a  Abdomen:  soft  Extremities:  warm   Intake/Output from previous day: 05/15 0701 - 05/16 0700 In: 1228.5 [P.O.:760; I.V.:464.5; IV Piggyback:4] Out: 1600 [Urine:1600] Intake/Output this shift: Total I/O In: 337 [P.O.:250; I.V.:85; IV Piggyback:2] Out: 500 [Urine:500]  Lab Results:  Basename 08/24/11 1030 08/24/11 0525  WBC 7.5 8.1  HGB 13.2 12.5*  HCT 38.6* 36.5*  PLT 210 212   BMET:  Basename 08/24/11 1030 08/24/11 0525  NA 139 136  K 4.1 3.9  CL 100 98  CO2 27 31  GLUCOSE 160* 131*  BUN 22 24*  CREATININE 1.35 1.42*  CALCIUM 9.8 9.5    CBG (last 3)  No results found for this basename: GLUCAP:3 in the last 72 hours PT/INR:  No results found for this basename: LABPROT,INR in the last 72 hours  CXR:  N/A  Assessment/Plan: S/P Procedure(s) (LRB): LEFT AND RIGHT HEART CATHETERIZATION WITH CORONARY ANGIOGRAM (N/A)  I have again reviewed the indications, risks and potential benefits of surgery with Mr Furches.  The rationale for elective mitral valve repair surgery has been explained, including a comparison between surgery and continued medical therapy with close follow-up.  The likelihood of successful and durable valve repair has been discussed with particular reference to the findings of their recent  echocardiogram.  Based upon these findings and previous experience, I have quoted them a greater than 90 percent likelihood of successful valve repair.  In the unlikely event that their valve cannot be successfully repaired, we discussed the possibility of replacing the mitral valve using a mechanical prosthesis with the attendant need for long-term anticoagulation versus the alternative of replacing it using a bioprosthetic tissue valve with its potential for late structural valve deterioration and failure, depending upon the patient's longevity.  The patient specifically requests that if the mitral valve must be replaced that it be done using a bioprosthetic tissue valve.  He understands and accepts all potential associated risks of surgery including but not limited to risk of death, stroke, myocardial infarction, congestive heart failure, respiratory failure, renal failure, bleeding requiring blood transfusion and/or reexploration, arrhythmia, heart block or bradycardia requiring permanent pacemaker, pneumonia, pleural effusion, wound infection, pulmonary embolus or other thromboembolic complication, chronic pain or other delayed complications.  All questions have been addressed.    Kunaal Walkins H 08/24/2011 1:51 PM

## 2011-08-24 NOTE — Progress Notes (Signed)
ANTICOAGULATION CONSULT NOTE - Follow Up Consult  Pharmacy Consult for heparin Indication: atrial fibrillation  Labs:  Basename 08/24/11 0525 08/23/11 2024 08/23/11 1146 08/23/11 0523 08/22/11 0841 08/22/11 0500  HGB 12.5* -- -- 12.4* -- --  HCT 36.5* -- -- 35.7* -- 36.6*  PLT 212 -- -- 184 -- 186  APTT -- -- -- -- -- --  LABPROT -- -- -- -- -- --  INR -- -- -- -- -- --  HEPARINUNFRC 0.52 0.73* 0.84* -- -- --  CREATININE -- -- -- 1.56* 1.36* --  CKTOTAL -- -- -- -- -- --  CKMB -- -- -- -- -- --  TROPONINI -- -- -- -- -- --    Assessment: 78yo male remains slightly subtherapeutic on heparin for Afib with low goal.  Goal of Therapy:  Heparin level 0.3-0.5 units/ml   Plan:  Will decrease heparin slightly to 1200 units/hr and check level in 8hr.  Colleen Can PharmD BCPS 08/24/2011,6:49 AM

## 2011-08-24 NOTE — Progress Notes (Signed)
   Subjective:  Mild SOB last pm.  Awaiting planned MVR for tomorrow with Dr. Barry Dienes.    Objective:  Vital Signs in the last 24 hours: Temp:  [98 F (36.7 C)-98.9 F (37.2 C)] 98.9 F (37.2 C) (05/16 0743) Pulse Rate:  [84-93] 93  (05/16 0743) Resp:  [16-18] 18  (05/16 0743) BP: (97-139)/(60-87) 133/87 mmHg (05/16 0743) SpO2:  [93 %-96 %] 93 % (05/16 0743) Weight:  [181 lb (82.1 kg)] 181 lb (82.1 kg) (05/16 0407)  Intake/Output from previous day: 05/15 0701 - 05/16 0700 In: 1228.5 [P.O.:760; I.V.:464.5; IV Piggyback:4] Out: 1600 [Urine:1600]   Physical Exam: General: thin older gentleman in no distress Head:  Normocephalic and atraumatic. Lungs: Soft basilar crackles.   Heart: irregular irregular with loud apical holosystolic murmur Pulses: Pulses normal in all 4 extremities. Extremities: No clubbing or cyanosis. No edema. Neurologic: Alert and oriented x 3.    Lab Results:  Basename 08/24/11 0525 08/23/11 0523  WBC 8.1 7.3  HGB 12.5* 12.4*  PLT 212 184    Basename 08/24/11 0525 08/23/11 0523  NA 136 138  K 3.9 4.8  CL 98 100  CO2 31 31  GLUCOSE 131* 123*  BUN 24* 27*  CREATININE 1.42* 1.56*   No results found for this basename: TROPONINI:2,CK,MB:2 in the last 72 hours Hepatic Function Panel No results found for this basename: PROT,ALBUMIN,AST,ALT,ALKPHOS,BILITOT,BILIDIR,IBILI in the last 72 hours No results found for this basename: CHOL in the last 72 hours No results found for this basename: PROTIME in the last 72 hours  Imaging: No results found.    Assessment/Plan:  Patient Active Hospital Problem List:   S/P aortic valve replacement with bioprosthetic valve (08/15/2007)   Assessment: stable   Plan: no need for replacement S/P CABG x 3 (08/15/2007)   Assessment: grafts intact   Plan: minimally invasive procedure planned for MVR Atrial fibrillation, persistent (08/18/2011)   Assessment: rate is controlled    Plan: continue carvedilol  Chronic  kidney disease (CKD) (08/18/2011)   Assessment: Cr remaining stable    Plan: monitor Severe mitral regurgitation (08/18/2011)   Assessment: murmur on exam   Plan: MV repair/replace tomorrow       Shawnie Pons, MD, [facility], FSCAI 08/24/2011, 9:27 AM

## 2011-08-25 ENCOUNTER — Encounter (HOSPITAL_COMMUNITY)
Admission: AD | Disposition: A | Discharge status: Home Health | Payer: Self-pay | Source: Other Acute Inpatient Hospital | Attending: Thoracic Surgery (Cardiothoracic Vascular Surgery)

## 2011-08-25 ENCOUNTER — Encounter (HOSPITAL_COMMUNITY): Payer: Self-pay | Admitting: Thoracic Surgery (Cardiothoracic Vascular Surgery)

## 2011-08-25 ENCOUNTER — Encounter (HOSPITAL_COMMUNITY): Payer: Self-pay | Admitting: Anesthesiology

## 2011-08-25 ENCOUNTER — Inpatient Hospital Stay (HOSPITAL_COMMUNITY): Payer: Medicare Other

## 2011-08-25 ENCOUNTER — Inpatient Hospital Stay (HOSPITAL_COMMUNITY): Payer: Medicare Other | Admitting: Anesthesiology

## 2011-08-25 DIAGNOSIS — I059 Rheumatic mitral valve disease, unspecified: Secondary | ICD-10-CM

## 2011-08-25 DIAGNOSIS — Z9889 Other specified postprocedural states: Secondary | ICD-10-CM

## 2011-08-25 DIAGNOSIS — I4891 Unspecified atrial fibrillation: Secondary | ICD-10-CM

## 2011-08-25 DIAGNOSIS — Z8679 Personal history of other diseases of the circulatory system: Secondary | ICD-10-CM

## 2011-08-25 HISTORY — PX: MAZE: SHX5063

## 2011-08-25 HISTORY — DX: Personal history of other diseases of the circulatory system: Z86.79

## 2011-08-25 HISTORY — DX: Other specified postprocedural states: Z98.890

## 2011-08-25 HISTORY — PX: MITRAL VALVE REPAIR: SHX2039

## 2011-08-25 LAB — POCT I-STAT, CHEM 8
Glucose, Bld: 111 mg/dL — ABNORMAL HIGH (ref 70–99)
HCT: 23 % — ABNORMAL LOW (ref 39.0–52.0)
Hemoglobin: 7.8 g/dL — ABNORMAL LOW (ref 13.0–17.0)
Potassium: 4 mEq/L (ref 3.5–5.1)
Sodium: 139 mEq/L (ref 135–145)

## 2011-08-25 LAB — POCT I-STAT 3, ART BLOOD GAS (G3+)
Acid-Base Excess: 1 mmol/L (ref 0.0–2.0)
Bicarbonate: 28.4 mEq/L — ABNORMAL HIGH (ref 20.0–24.0)
Bicarbonate: 25.1 mEq/L — ABNORMAL HIGH (ref 20.0–24.0)
O2 Saturation: 95 %
O2 Saturation: 100 %
O2 Saturation: 100 %
O2 Saturation: 88 %
TCO2: 27 mmol/L (ref 0–100)
TCO2: 26 mmol/L (ref 0–100)
pCO2 arterial: 40.7 mmHg (ref 35.0–45.0)
pCO2 arterial: 41.2 mmHg (ref 35.0–45.0)
pCO2 arterial: 48.6 mmHg — ABNORMAL HIGH (ref 35.0–45.0)
pH, Arterial: 7.402 (ref 7.350–7.450)
pO2, Arterial: 72 mmHg — ABNORMAL LOW (ref 80.0–100.0)
pO2, Arterial: 336 mmHg — ABNORMAL HIGH (ref 80.0–100.0)
pO2, Arterial: 372 mmHg — ABNORMAL HIGH (ref 80.0–100.0)
pO2, Arterial: 54 mmHg — ABNORMAL LOW (ref 80.0–100.0)

## 2011-08-25 LAB — HEMOGLOBIN AND HEMATOCRIT, BLOOD: HCT: 21.4 % — ABNORMAL LOW (ref 39.0–52.0)

## 2011-08-25 LAB — POCT I-STAT 4, (NA,K, GLUC, HGB,HCT)
Glucose, Bld: 122 mg/dL — ABNORMAL HIGH (ref 70–99)
Glucose, Bld: 112 mg/dL — ABNORMAL HIGH (ref 70–99)
Glucose, Bld: 193 mg/dL — ABNORMAL HIGH (ref 70–99)
HCT: 26 % — ABNORMAL LOW (ref 39.0–52.0)
HCT: 34 % — ABNORMAL LOW (ref 39.0–52.0)
HCT: 26 % — ABNORMAL LOW (ref 39.0–52.0)
HCT: 25 % — ABNORMAL LOW (ref 39.0–52.0)
HCT: 20 % — ABNORMAL LOW (ref 39.0–52.0)
HCT: 29 % — ABNORMAL LOW (ref 39.0–52.0)
Hemoglobin: 11.6 g/dL — ABNORMAL LOW (ref 13.0–17.0)
Hemoglobin: 8.8 g/dL — ABNORMAL LOW (ref 13.0–17.0)
Hemoglobin: 6.8 g/dL — CL (ref 13.0–17.0)
Potassium: 4.3 mEq/L (ref 3.5–5.1)
Potassium: 3.6 mEq/L (ref 3.5–5.1)
Potassium: 4.5 mEq/L (ref 3.5–5.1)
Potassium: 4.1 mEq/L (ref 3.5–5.1)
Sodium: 139 mEq/L (ref 135–145)
Sodium: 138 mEq/L (ref 135–145)
Sodium: 136 mEq/L (ref 135–145)
Sodium: 136 mEq/L (ref 135–145)
Sodium: 135 mEq/L (ref 135–145)

## 2011-08-25 LAB — CBC
HCT: 37.2 % — ABNORMAL LOW (ref 39.0–52.0)
HCT: 23.2 % — ABNORMAL LOW (ref 39.0–52.0)
Hemoglobin: 8.2 g/dL — ABNORMAL LOW (ref 13.0–17.0)
MCH: 30.1 pg (ref 26.0–34.0)
MCV: 86.2 fL (ref 78.0–100.0)
Platelets: 171 10*3/uL (ref 150–400)
RBC: 4.08 MIL/uL — ABNORMAL LOW (ref 4.22–5.81)
RBC: 3.16 MIL/uL — ABNORMAL LOW (ref 4.22–5.81)
RDW: 14 % (ref 11.5–15.5)
WBC: 8.4 10*3/uL (ref 4.0–10.5)
WBC: 21 10*3/uL — ABNORMAL HIGH (ref 4.0–10.5)
WBC: 20.8 10*3/uL — ABNORMAL HIGH (ref 4.0–10.5)

## 2011-08-25 LAB — CARBOXYHEMOGLOBIN
Carboxyhemoglobin: 1.4 % (ref 0.5–1.5)
Carboxyhemoglobin: 1.6 % — ABNORMAL HIGH (ref 0.5–1.5)
Methemoglobin: 0.8 % (ref 0.0–1.5)
O2 Saturation: 61.2 %
Total hemoglobin: 9.7 g/dL — ABNORMAL LOW (ref 13.5–18.0)
Total hemoglobin: 6.8 g/dL — CL (ref 13.5–18.0)

## 2011-08-25 LAB — MAGNESIUM: Magnesium: 2.7 mg/dL — ABNORMAL HIGH (ref 1.5–2.5)

## 2011-08-25 LAB — HEMOGLOBIN A1C: Hgb A1c MFr Bld: 6.1 % — ABNORMAL HIGH (ref <5.7)

## 2011-08-25 LAB — CREATININE, SERUM
GFR calc Af Amer: 66 mL/min — ABNORMAL LOW (ref >90)
GFR calc non Af Amer: 57 mL/min — ABNORMAL LOW (ref >90)

## 2011-08-25 LAB — PLATELET COUNT: Platelets: 80 10*3/uL — ABNORMAL LOW (ref 150–400)

## 2011-08-25 LAB — POCT I-STAT GLUCOSE: Glucose, Bld: 117 mg/dL — ABNORMAL HIGH (ref 70–99)

## 2011-08-25 LAB — PROTIME-INR
INR: 1.5 — ABNORMAL HIGH (ref 0.00–1.49)
Prothrombin Time: 18.4 seconds — ABNORMAL HIGH (ref 11.6–15.2)

## 2011-08-25 LAB — APTT: aPTT: 38 seconds — ABNORMAL HIGH (ref 24–37)

## 2011-08-25 SURGERY — REPAIR, MITRAL VALVE, MINIMALLY INVASIVE
Anesthesia: General | Site: Chest | Laterality: Right | Wound class: Clean

## 2011-08-25 MED ORDER — SODIUM CHLORIDE 0.9 % IV SOLN
INTRAVENOUS | Status: DC
  Filled 2011-08-25 (×2): qty 1

## 2011-08-25 MED ORDER — SODIUM CHLORIDE 0.9 % IR SOLN
Status: DC | PRN
  Administered 2011-08-25: 6000 mL

## 2011-08-25 MED ORDER — MORPHINE SULFATE 2 MG/ML IJ SOLN
1.0000 mg | INTRAMUSCULAR | Status: AC | PRN
  Administered 2011-08-25 – 2011-08-26 (×4): 1 mg via INTRAVENOUS

## 2011-08-25 MED ORDER — VANCOMYCIN HCL IN DEXTROSE 1-5 GM/200ML-% IV SOLN
1000.0000 mg | Freq: Once | INTRAVENOUS | Status: AC
  Administered 2011-08-25: 1000 mg via INTRAVENOUS
  Filled 2011-08-25: qty 200

## 2011-08-25 MED ORDER — ACETAMINOPHEN 160 MG/5ML PO SOLN: 650.0000 mg | ORAL | Status: AC

## 2011-08-25 MED ORDER — NOREPINEPHRINE BITARTRATE 1 MG/ML IJ SOLN
2.0000 ug/min | INTRAVENOUS | Status: DC
  Filled 2011-08-25: qty 8

## 2011-08-25 MED ORDER — SODIUM CHLORIDE 0.9 % IV SOLN
0.1000 ug/kg/h | INTRAVENOUS | Status: DC
  Administered 2011-08-25: 0.6 ug/kg/h via INTRAVENOUS
  Administered 2011-08-25: 0.7 ug/kg/h via INTRAVENOUS
  Administered 2011-08-26 (×2): 0.5 ug/kg/h via INTRAVENOUS
  Filled 2011-08-25 (×4): qty 2

## 2011-08-25 MED ORDER — PANTOPRAZOLE SODIUM 40 MG PO TBEC
40.0000 mg | DELAYED_RELEASE_TABLET | Freq: Every day | ORAL | Status: DC
  Administered 2011-08-27 – 2011-09-03 (×8): 40 mg via ORAL
  Filled 2011-08-25 (×8): qty 1

## 2011-08-25 MED ORDER — LACTATED RINGERS IV SOLN
500.0000 mL | Freq: Once | INTRAVENOUS | Status: AC | PRN
  Administered 2011-08-25: 500 mL via INTRAVENOUS

## 2011-08-25 MED ORDER — SODIUM CHLORIDE 0.9 % IV SOLN
INTRAVENOUS | Status: DC
  Administered 2011-08-25: 20 mL/h via INTRAVENOUS

## 2011-08-25 MED ORDER — SODIUM CHLORIDE 0.9 % IV SOLN: 250.0000 mL | INTRAVENOUS | Status: DC

## 2011-08-25 MED ORDER — DOCUSATE SODIUM 100 MG PO CAPS
200.0000 mg | ORAL_CAPSULE | Freq: Every day | ORAL | Status: DC
  Administered 2011-08-26 – 2011-09-02 (×7): 200 mg via ORAL
  Filled 2011-08-25 (×10): qty 2

## 2011-08-25 MED ORDER — MAGNESIUM SULFATE 40 MG/ML IJ SOLN
4.0000 g | Freq: Once | INTRAMUSCULAR | Status: AC
  Administered 2011-08-25: 4 g via INTRAVENOUS
  Filled 2011-08-25: qty 100

## 2011-08-25 MED ORDER — FENTANYL CITRATE 0.05 MG/ML IJ SOLN
INTRAMUSCULAR | Status: DC | PRN
  Administered 2011-08-25: 50 ug via INTRAVENOUS
  Administered 2011-08-25: 500 ug via INTRAVENOUS
  Administered 2011-08-25: 450 ug via INTRAVENOUS

## 2011-08-25 MED ORDER — DOPAMINE-DEXTROSE 3.2-5 MG/ML-% IV SOLN: 3.0000 ug/kg/min | INTRAVENOUS | Status: DC

## 2011-08-25 MED ORDER — ASPIRIN EC 325 MG PO TBEC
325.0000 mg | DELAYED_RELEASE_TABLET | Freq: Every day | ORAL | Status: DC
  Administered 2011-08-26 – 2011-08-27 (×2): 325 mg via ORAL
  Filled 2011-08-25 (×2): qty 1

## 2011-08-25 MED ORDER — DOPAMINE-DEXTROSE 1.6-5 MG/ML-% IV SOLN
INTRAVENOUS | Status: DC | PRN
  Administered 2011-08-25: 5 ug/kg/min via INTRAVENOUS

## 2011-08-25 MED ORDER — AMIODARONE HCL IN DEXTROSE 360-4.14 MG/200ML-% IV SOLN
INTRAVENOUS | Status: DC | PRN
  Administered 2011-08-25: 150 mg via INTRAVENOUS

## 2011-08-25 MED ORDER — SODIUM CHLORIDE 0.9 % IJ SOLN: 3.0000 mL | INTRAMUSCULAR | Status: DC | PRN

## 2011-08-25 MED ORDER — AMIODARONE HCL IN DEXTROSE 360-4.14 MG/200ML-% IV SOLN
30.0000 mg/h | INTRAVENOUS | Status: DC
  Administered 2011-08-25 – 2011-08-27 (×6): 30 mg/h via INTRAVENOUS
  Filled 2011-08-25 (×12): qty 200

## 2011-08-25 MED ORDER — MILRINONE IN DEXTROSE 200-5 MCG/ML-% IV SOLN
INTRAVENOUS | Status: DC | PRN
  Administered 2011-08-25: .3 mg/kg/min via INTRAVENOUS

## 2011-08-25 MED ORDER — METOPROLOL TARTRATE 25 MG/10 ML ORAL SUSPENSION
12.5000 mg | Freq: Two times a day (BID) | ORAL | Status: DC
  Filled 2011-08-25 (×7): qty 5

## 2011-08-25 MED ORDER — ALBUMIN HUMAN 5 % IV SOLN
250.0000 mL | INTRAVENOUS | Status: AC | PRN
  Administered 2011-08-25 (×3): 250 mL via INTRAVENOUS
  Filled 2011-08-25: qty 500

## 2011-08-25 MED ORDER — ALBUMIN HUMAN 5 % IV SOLN
INTRAVENOUS | Status: DC | PRN
  Administered 2011-08-25: 14:00:00 via INTRAVENOUS

## 2011-08-25 MED ORDER — LACTATED RINGERS IV SOLN
INTRAVENOUS | Status: DC
  Administered 2011-08-25: 20 mL/h via INTRAVENOUS

## 2011-08-25 MED ORDER — PROTAMINE SULFATE 10 MG/ML IV SOLN
INTRAVENOUS | Status: DC | PRN
  Administered 2011-08-25: 25 mg via INTRAVENOUS
  Administered 2011-08-25 (×6): 50 mg via INTRAVENOUS
  Administered 2011-08-25: 10 mg via INTRAVENOUS
  Administered 2011-08-25: 15 mg via INTRAVENOUS

## 2011-08-25 MED ORDER — DEXTROSE 5 % IV SOLN
0.0000 ug/min | INTRAVENOUS | Status: DC
  Administered 2011-08-25: 30 ug/min via INTRAVENOUS
  Administered 2011-08-26: 25 ug/min via INTRAVENOUS
  Filled 2011-08-25 (×2): qty 2

## 2011-08-25 MED ORDER — INSULIN REGULAR BOLUS VIA INFUSION
0.0000 [IU] | Freq: Three times a day (TID) | INTRAVENOUS | Status: DC
  Filled 2011-08-25: qty 10

## 2011-08-25 MED ORDER — OXYCODONE HCL 5 MG PO TABS
5.0000 mg | ORAL_TABLET | ORAL | Status: DC | PRN
  Administered 2011-08-26 – 2011-08-27 (×2): 10 mg via ORAL
  Filled 2011-08-25 (×2): qty 2

## 2011-08-25 MED ORDER — AMIODARONE HCL IN DEXTROSE 360-4.14 MG/200ML-% IV SOLN
60.0000 mg/h | INTRAVENOUS | Status: DC
  Filled 2011-08-25: qty 200

## 2011-08-25 MED ORDER — VECURONIUM BROMIDE 10 MG IV SOLR
INTRAVENOUS | Status: DC | PRN
  Administered 2011-08-25 (×3): 10 mg via INTRAVENOUS

## 2011-08-25 MED ORDER — MILRINONE IN DEXTROSE 200-5 MCG/ML-% IV SOLN
0.3000 ug/kg/min | INTRAVENOUS | Status: DC
  Administered 2011-08-26 – 2011-08-27 (×4): 0.3 ug/kg/min via INTRAVENOUS
  Filled 2011-08-25 (×5): qty 100

## 2011-08-25 MED ORDER — MORPHINE SULFATE 2 MG/ML IJ SOLN
2.0000 mg | INTRAMUSCULAR | Status: DC | PRN
Start: 2011-08-25 — End: 2011-08-28
  Administered 2011-08-26: 2 mg via INTRAVENOUS
  Filled 2011-08-25 (×4): qty 1

## 2011-08-25 MED ORDER — CALCIUM CHLORIDE 10 % IV SOLN
INTRAVENOUS | Status: DC | PRN
  Administered 2011-08-25: 400 mg via INTRAVENOUS

## 2011-08-25 MED ORDER — ROCURONIUM BROMIDE 100 MG/10ML IV SOLN
INTRAVENOUS | Status: DC | PRN
  Administered 2011-08-25: 50 mg via INTRAVENOUS

## 2011-08-25 MED ORDER — ACETAMINOPHEN 500 MG PO TABS
1000.0000 mg | ORAL_TABLET | Freq: Four times a day (QID) | ORAL | Status: AC
  Administered 2011-08-26 – 2011-08-30 (×15): 1000 mg via ORAL
  Filled 2011-08-25 (×19): qty 2

## 2011-08-25 MED ORDER — MIDAZOLAM HCL 2 MG/2ML IJ SOLN
2.0000 mg | INTRAMUSCULAR | Status: DC | PRN
  Administered 2011-08-25 (×2): 1 mg via INTRAVENOUS
  Filled 2011-08-25: qty 2

## 2011-08-25 MED ORDER — METOPROLOL TARTRATE 1 MG/ML IV SOLN
2.5000 mg | INTRAVENOUS | Status: DC | PRN
  Administered 2011-08-26: 2.5 mg via INTRAVENOUS
  Administered 2011-08-27: 5 mg via INTRAVENOUS

## 2011-08-25 MED ORDER — FAMOTIDINE IN NACL 20-0.9 MG/50ML-% IV SOLN
20.0000 mg | Freq: Two times a day (BID) | INTRAVENOUS | Status: AC
  Administered 2011-08-25: 20 mg via INTRAVENOUS

## 2011-08-25 MED ORDER — METOPROLOL TARTRATE 12.5 MG HALF TABLET
12.5000 mg | ORAL_TABLET | Freq: Two times a day (BID) | ORAL | Status: DC
  Administered 2011-08-26 – 2011-08-28 (×4): 12.5 mg via ORAL
  Filled 2011-08-25 (×7): qty 1

## 2011-08-25 MED ORDER — SODIUM CHLORIDE 0.9 % IJ SOLN
3.0000 mL | Freq: Two times a day (BID) | INTRAMUSCULAR | Status: DC
  Administered 2011-08-26 – 2011-08-27 (×4): 3 mL via INTRAVENOUS

## 2011-08-25 MED ORDER — MIDAZOLAM HCL 5 MG/5ML IJ SOLN
INTRAMUSCULAR | Status: DC | PRN
  Administered 2011-08-25 (×2): 2 mg via INTRAVENOUS
  Administered 2011-08-25: 1 mg via INTRAVENOUS
  Administered 2011-08-25: 5 mg via INTRAVENOUS

## 2011-08-25 MED ORDER — HEPARIN SODIUM (PORCINE) 1000 UNIT/ML IJ SOLN
INTRAMUSCULAR | Status: DC | PRN
  Administered 2011-08-25: 40000 [IU] via INTRAVENOUS

## 2011-08-25 MED ORDER — BISACODYL 10 MG RE SUPP: 10.0000 mg | Freq: Every day | RECTAL | Status: DC

## 2011-08-25 MED ORDER — NITROGLYCERIN IN D5W 200-5 MCG/ML-% IV SOLN: 0.0000 ug/min | INTRAVENOUS | Status: DC

## 2011-08-25 MED ORDER — AMIODARONE HCL IN DEXTROSE 360-4.14 MG/200ML-% IV SOLN
INTRAVENOUS | Status: DC | PRN
  Administered 2011-08-25: 60 mg/h via INTRAVENOUS

## 2011-08-25 MED ORDER — EPINEPHRINE HCL 1 MG/ML IJ SOLN
0.0000 ug/min | INTRAVENOUS | Status: DC
  Filled 2011-08-25 (×2): qty 4

## 2011-08-25 MED ORDER — LACTATED RINGERS IV SOLN
INTRAVENOUS | Status: DC | PRN
  Administered 2011-08-25 (×2): via INTRAVENOUS

## 2011-08-25 MED ORDER — BISACODYL 5 MG PO TBEC
10.0000 mg | DELAYED_RELEASE_TABLET | Freq: Every day | ORAL | Status: DC
  Administered 2011-08-26 – 2011-09-01 (×6): 10 mg via ORAL
  Filled 2011-08-25 (×6): qty 2

## 2011-08-25 MED ORDER — ACETAMINOPHEN 160 MG/5ML PO SOLN
975.0000 mg | Freq: Four times a day (QID) | ORAL | Status: DC
  Administered 2011-08-26 (×2): 975 mg
  Filled 2011-08-25 (×2): qty 40.6

## 2011-08-25 MED ORDER — SODIUM CHLORIDE 0.9 % IR SOLN
Status: DC | PRN
  Administered 2011-08-25: 12:00:00

## 2011-08-25 MED ORDER — ASPIRIN 81 MG PO CHEW: 324.0000 mg | CHEWABLE_TABLET | Freq: Every day | ORAL | Status: DC

## 2011-08-25 MED ORDER — ONDANSETRON HCL 4 MG/2ML IJ SOLN
4.0000 mg | Freq: Four times a day (QID) | INTRAMUSCULAR | Status: DC | PRN
  Administered 2011-08-26 – 2011-08-27 (×2): 4 mg via INTRAVENOUS
  Filled 2011-08-25 (×2): qty 2

## 2011-08-25 MED ORDER — PHENYLEPHRINE HCL 10 MG/ML IJ SOLN
20.0000 mg | INTRAVENOUS | Status: DC | PRN
  Administered 2011-08-25: 100 ug/min via INTRAVENOUS

## 2011-08-25 MED ORDER — POTASSIUM CHLORIDE 10 MEQ/50ML IV SOLN
10.0000 meq | INTRAVENOUS | Status: AC
  Administered 2011-08-25 (×3): 10 meq via INTRAVENOUS

## 2011-08-25 MED ORDER — ACETAMINOPHEN 650 MG RE SUPP
650.0000 mg | RECTAL | Status: AC
  Administered 2011-08-25: 650 mg via RECTAL

## 2011-08-25 MED ORDER — PROPOFOL 10 MG/ML IV EMUL
INTRAVENOUS | Status: DC | PRN
  Administered 2011-08-25: 50 mg via INTRAVENOUS

## 2011-08-25 MED ORDER — DEXTROSE 5 % IV SOLN
1.5000 g | Freq: Two times a day (BID) | INTRAVENOUS | Status: AC
  Administered 2011-08-25 – 2011-08-27 (×4): 1.5 g via INTRAVENOUS
  Filled 2011-08-25 (×4): qty 1.5

## 2011-08-25 MED ORDER — SODIUM CHLORIDE 0.9 % IR SOLN
Status: DC | PRN
  Administered 2011-08-25: 3000 mL

## 2011-08-25 MED ORDER — SODIUM CHLORIDE 0.45 % IV SOLN
INTRAVENOUS | Status: DC
  Administered 2011-08-25: 20 mL/h via INTRAVENOUS
  Administered 2011-08-27: 02:00:00 via INTRAVENOUS

## 2011-08-25 SURGICAL SUPPLY — 121 items
ADAPTER CARDIO PERF ANTE/RETRO (ADAPTER) ×3 IMPLANT
BAG DECANTER FOR FLEXI CONT (MISCELLANEOUS) ×3 IMPLANT
BENZOIN TINCTURE PRP APPL 2/3 (GAUZE/BANDAGES/DRESSINGS) ×3 IMPLANT
BLADE CORE FAN STRYKER (BLADE) ×3 IMPLANT
BLADE SURG 11 STRL SS (BLADE) ×3 IMPLANT
CANISTER SUCTION 2500CC (MISCELLANEOUS) ×6 IMPLANT
CANNULA FEM VENOUS REMOTE 22FR (CANNULA) ×3 IMPLANT
CANNULA FEMORAL ART 14 SM (MISCELLANEOUS) ×3 IMPLANT
CANNULA GUNDRY RCSP 15FR (MISCELLANEOUS) ×3 IMPLANT
CANNULA OPTISITE PERFUSION 16F (CANNULA) IMPLANT
CANNULA OPTISITE PERFUSION 18F (CANNULA) IMPLANT
CARDIAC SUCTION (MISCELLANEOUS) ×3 IMPLANT
CARDIOBLATE CARDIAC ABLATION (MISCELLANEOUS)
CATH KIT ON Q 5IN SLV (PAIN MANAGEMENT) IMPLANT
CLOTH BEACON ORANGE TIMEOUT ST (SAFETY) ×3 IMPLANT
CONN ST 1/4X3/8  BEN (MISCELLANEOUS) ×2
CONN ST 1/4X3/8 BEN (MISCELLANEOUS) ×4 IMPLANT
CONT SPEC STER OR (MISCELLANEOUS) ×3 IMPLANT
COVER MAYO STAND STRL (DRAPES) ×3 IMPLANT
COVER SURGICAL LIGHT HANDLE (MISCELLANEOUS) ×6 IMPLANT
CRADLE DONUT ADULT HEAD (MISCELLANEOUS) ×3 IMPLANT
DERMABOND ADVANCED (GAUZE/BANDAGES/DRESSINGS) ×2
DERMABOND ADVANCED .7 DNX12 (GAUZE/BANDAGES/DRESSINGS) ×4 IMPLANT
DEVICE CARDIOBLATE CARDIAC ABL (MISCELLANEOUS) IMPLANT
DEVICE PMI PUNCTURE CLOSURE (MISCELLANEOUS) ×3 IMPLANT
DEVICE TROCAR PUNCTURE CLOSURE (ENDOMECHANICALS) ×3 IMPLANT
DRAIN CHANNEL 28F RND 3/8 FF (WOUND CARE) ×6 IMPLANT
DRAPE BILATERAL SPLIT (DRAPES) ×3 IMPLANT
DRAPE C-ARM 42X72 X-RAY (DRAPES) ×3 IMPLANT
DRAPE CV SPLIT W-CLR ANES SCRN (DRAPES) ×3 IMPLANT
DRAPE INCISE IOBAN 66X45 STRL (DRAPES) ×6 IMPLANT
DRAPE SLUSH MACHINE 52X66 (DRAPES) IMPLANT
DRAPE SLUSH/WARMER DISC (DRAPES) IMPLANT
DRSG COVADERM 4X8 (GAUZE/BANDAGES/DRESSINGS) ×3 IMPLANT
ELECT BLADE 6.5 EXT (BLADE) ×3 IMPLANT
ELECT REM PT RETURN 9FT ADLT (ELECTROSURGICAL) ×6
ELECTRODE REM PT RTRN 9FT ADLT (ELECTROSURGICAL) ×4 IMPLANT
FEMORAL VENOUS CANN RAP (CANNULA) IMPLANT
GLOVE BIO SURGEON STRL SZ 6.5 (GLOVE) ×9 IMPLANT
GLOVE BIO SURGEON STRL SZ7 (GLOVE) ×3 IMPLANT
GLOVE BIOGEL PI IND STRL 7.0 (GLOVE) ×10 IMPLANT
GLOVE BIOGEL PI INDICATOR 7.0 (GLOVE) ×5
GLOVE ORTHO TXT STRL SZ7.5 (GLOVE) ×9 IMPLANT
GOWN STRL NON-REIN LRG LVL3 (GOWN DISPOSABLE) ×12 IMPLANT
GUIDEWIRE ANG ZIPWIRE 038X150 (WIRE) ×3 IMPLANT
INSERT CONFORM CROSS CLAMP 66M (MISCELLANEOUS) IMPLANT
INSERT CONFORM CROSS CLAMP 86M (MISCELLANEOUS) IMPLANT
KIT BASIN OR (CUSTOM PROCEDURE TRAY) ×3 IMPLANT
KIT DILATOR VASC 18G NDL (KITS) ×3 IMPLANT
KIT DRAINAGE VACCUM ASSIST (KITS) ×3 IMPLANT
KIT ROOM TURNOVER OR (KITS) ×3 IMPLANT
KIT SUCTION CATH 14FR (SUCTIONS) ×3 IMPLANT
LEAD PACING MYOCARDI (MISCELLANEOUS) ×9 IMPLANT
LINE VENT (MISCELLANEOUS) ×3 IMPLANT
NEEDLE AORTIC ROOT 14G 7F (CATHETERS) ×3 IMPLANT
NS IRRIG 1000ML POUR BTL (IV SOLUTION) ×9 IMPLANT
PACK OPEN HEART (CUSTOM PROCEDURE TRAY) ×3 IMPLANT
PAD ARMBOARD 7.5X6 YLW CONV (MISCELLANEOUS) ×6 IMPLANT
PAD ELECT DEFIB RADIOL ZOLL (MISCELLANEOUS) ×3 IMPLANT
PATCH CORMATRIX 4CMX7CM (Prosthesis & Implant Heart) ×3 IMPLANT
PROBE CRYO2-ABLATION MALLABLE (MISCELLANEOUS) ×3 IMPLANT
RETRACTOR TRL SOFT TISSUE LG (INSTRUMENTS) IMPLANT
RETRACTOR TRM SOFT TISSUE 7.5 (INSTRUMENTS) ×3 IMPLANT
RING MITRAL MEMO 3D 28MM SMD28 (Prosthesis & Implant Heart) ×3 IMPLANT
SET CANNULATION TOURNIQUET (MISCELLANEOUS) ×3 IMPLANT
SET CARDIOPLEGIA MPS 5001102 (MISCELLANEOUS) ×3 IMPLANT
SET IRRIG TUBING LAPAROSCOPIC (IRRIGATION / IRRIGATOR) ×3 IMPLANT
SOLUTION ANTI FOG 6CC (MISCELLANEOUS) ×3 IMPLANT
SPONGE GAUZE 4X4 12PLY (GAUZE/BANDAGES/DRESSINGS) ×3 IMPLANT
SUCKER WEIGHTED FLEX (MISCELLANEOUS) ×6 IMPLANT
SUT BONE WAX W31G (SUTURE) ×3 IMPLANT
SUT E-PACK MINIMALLY INVASIVE (SUTURE) ×3 IMPLANT
SUT ETHIBOND (SUTURE) ×9 IMPLANT
SUT ETHIBOND 2 0 SH (SUTURE) IMPLANT
SUT ETHIBOND 2 0 V4 (SUTURE) IMPLANT
SUT ETHIBOND 2 0V4 GREEN (SUTURE) IMPLANT
SUT ETHIBOND 2-0 RB-1 WHT (SUTURE) ×9 IMPLANT
SUT ETHIBOND 4 0 TF (SUTURE) IMPLANT
SUT ETHIBOND 5 0 C 1 30 (SUTURE) IMPLANT
SUT ETHIBOND NAB MH 2-0 36IN (SUTURE) IMPLANT
SUT ETHIBOND X763 2 0 SH 1 (SUTURE) ×3 IMPLANT
SUT GORETEX 6.0 TH-9 30 IN (SUTURE) IMPLANT
SUT GORETEX CV 4 TH 22 36 (SUTURE) ×3 IMPLANT
SUT GORETEX CV-5THC-13 36IN (SUTURE) ×9 IMPLANT
SUT GORETEX CV4 TH-18 (SUTURE) ×9 IMPLANT
SUT GORETEX TH-18 36 INCH (SUTURE) IMPLANT
SUT MNCRL AB 3-0 PS2 18 (SUTURE) IMPLANT
SUT PROLENE 3 0 SH DA (SUTURE) IMPLANT
SUT PROLENE 3 0 SH1 36 (SUTURE) ×12 IMPLANT
SUT PROLENE 4 0 RB 1 (SUTURE) ×2
SUT PROLENE 4-0 RB1 .5 CRCL 36 (SUTURE) ×4 IMPLANT
SUT PROLENE 5 0 C 1 36 (SUTURE) ×6 IMPLANT
SUT PROLENE 6 0 C 1 30 (SUTURE) IMPLANT
SUT SILK  1 MH (SUTURE)
SUT SILK 1 MH (SUTURE) IMPLANT
SUT SILK 1 TIES 10X30 (SUTURE) IMPLANT
SUT SILK 2 0 SH CR/8 (SUTURE) IMPLANT
SUT SILK 2 0 TIES 10X30 (SUTURE) IMPLANT
SUT SILK 2 0SH CR/8 30 (SUTURE) IMPLANT
SUT SILK 3 0 (SUTURE)
SUT SILK 3 0 SH CR/8 (SUTURE) IMPLANT
SUT SILK 3 0SH CR/8 30 (SUTURE) IMPLANT
SUT SILK 3-0 18XBRD TIE 12 (SUTURE) IMPLANT
SUT TEM PAC WIRE 2 0 SH (SUTURE) IMPLANT
SUT VIC AB 2-0 CTX 36 (SUTURE) IMPLANT
SUT VIC AB 2-0 UR6 27 (SUTURE) IMPLANT
SUT VIC AB 3-0 SH 8-18 (SUTURE) IMPLANT
SUT VICRYL 2 TP 1 (SUTURE) IMPLANT
SYRINGE 10CC LL (SYRINGE) ×3 IMPLANT
SYS ATRICLIP LAA EXCLUSION 45 (CLIP) IMPLANT
SYSTEM SAHARA CHEST DRAIN ATS (WOUND CARE) ×3 IMPLANT
TOWEL OR 17X24 6PK STRL BLUE (TOWEL DISPOSABLE) ×6 IMPLANT
TOWEL OR 17X26 10 PK STRL BLUE (TOWEL DISPOSABLE) ×3 IMPLANT
TRAY FOLEY IC TEMP SENS 14FR (CATHETERS) ×3 IMPLANT
TROCAR XCEL BLADELESS 5X75MML (TROCAR) ×6 IMPLANT
TROCAR XCEL NON-BLD 11X100MML (ENDOMECHANICALS) ×6 IMPLANT
TUBE SUCT INTRACARD DLP 20F (MISCELLANEOUS) ×3 IMPLANT
TUNNELER SHEATH ON-Q 11GX8 (MISCELLANEOUS) IMPLANT
UNDERPAD 30X30 INCONTINENT (UNDERPADS AND DIAPERS) ×3 IMPLANT
WATER STERILE IRR 1000ML POUR (IV SOLUTION) ×6 IMPLANT
WIRE BENTSON .035X145CM (WIRE) ×3 IMPLANT

## 2011-08-25 NOTE — Transfer of Care (Signed)
Immediate Anesthesia Transfer of Care Note  Patient: [name]  Procedure(s) Performed: Procedure(s) (LRB): MINIMALLY INVASIVE MITRAL VALVE REPAIR (MVR) (Right) MAZE (N/A)  Patient Location: SICU  Anesthesia Type: General  Level of Consciousness: Patient remains intubated per anesthesia plan  Airway & Oxygen Therapy: Patient remains intubated per anesthesia plan and Patient placed on Ventilator (see vital sign flow sheet for setting)  Post-op Assessment: Report given to PACU RN and Post -op Vital signs reviewed and stable  Post vital signs: Reviewed and stable  Complications: No apparent anesthesia complications

## 2011-08-25 NOTE — Anesthesia Procedure Notes (Signed)
Procedure Name: Intubation Date/Time: 08/25/2011 7:57 AM Performed by: Elon Alas Pre-anesthesia Checklist: Emergency Drugs available, Patient identified, Timeout performed, Patient being monitored and Suction available Patient Re-evaluated:Patient Re-evaluated prior to inductionOxygen Delivery Method: Circle system utilized Preoxygenation: Pre-oxygenation with 100% oxygen Intubation Type: IV induction Ventilation: Mask ventilation without difficulty and Oral airway inserted - appropriate to patient size Laryngoscope Size: Mac and 4 Grade View: Grade I Endobronchial tube: Double lumen EBT, Left, EBT position confirmed by fiberoptic bronchoscope and EBT position confirmed by auscultation and 37 Fr Number of attempts: 2 Airway Equipment and Method: Stylet Placement Confirmation: positive ETCO2,  ETT inserted through vocal cords under direct vision and breath sounds checked- equal and bilateral Tube secured with: Tape Dental Injury: Teeth and Oropharynx as per pre-operative assessment

## 2011-08-25 NOTE — Progress Notes (Signed)
  Echocardiogram Echocardiogram Transesophageal has been performed.  Mercy Moore 08/25/2011, 9:11 AM

## 2011-08-25 NOTE — Progress Notes (Signed)
TCTS BRIEF SICU PROGRESS NOTE  Day of Surgery  S/P Procedure(s) (LRB): MINIMALLY INVASIVE MITRAL VALVE REPAIR (MVR) (Right) MAZE (N/A)   Sedated on vent AAI paced with stable hemodynamics PA pressures low on nitric oxide at 30 ppm Chest tube output trending down UOP excellent  Plan: Will wean nitric oxide overnight if he remains stable with plans to wean vent and extubate in am  Hannah Crill H 08/25/2011 7:58 PM

## 2011-08-25 NOTE — Anesthesia Preprocedure Evaluation (Addendum)
Anesthesia Evaluation  Patient identified by MRN, date of birth, ID band Patient awake    Reviewed: Allergy & Precautions, H&P , NPO status , Patient's Chart, lab work & pertinent test results, reviewed documented beta blocker date and time   Airway Mallampati: II TM Distance: >3 FB Neck ROM: Full    Dental  (+) Edentulous Upper and Edentulous Lower   Pulmonary shortness of breath,  breath sounds clear to auscultation        Cardiovascular hypertension, Pt. on home beta blockers and Pt. on medications + CAD and +CHF + dysrhythmias Atrial Fibrillation + Valvular Problems/Murmurs MR Rhythm:Irregular Rate:Abnormal  EF 55-65% per cath report, severe mitral valve regurgitation, pulm htn secondary to MR   Neuro/Psych    GI/Hepatic PUD,   Endo/Other    Renal/GU Renal InsufficiencyRenal disease     Musculoskeletal   Abdominal   Peds  Hematology   Anesthesia Other Findings   Reproductive/Obstetrics                        Anesthesia Physical Anesthesia Plan  ASA: III  Anesthesia Plan: General   Post-op Pain Management:    Induction: Intravenous  Airway Management Planned: Oral ETT and Double Lumen EBT  Additional Equipment: Arterial line, CVP, PA Cath, 3D TEE and Ultrasound Guidance Line Placement  Intra-op Plan:   Post-operative Plan: Post-operative intubation/ventilation  Informed Consent: I have reviewed the patients History and Physical, chart, labs and discussed the procedure including the risks, benefits and alternatives for the proposed anesthesia with the patient or authorized representative who has indicated his/her understanding and acceptance.   Dental advisory given  Plan Discussed with: CRNA and Anesthesiologist  Anesthesia Plan Comments: (Mitral Regurgitation with CHF Chronic Afib Moderate-severe LV dysfunction Pulmonary hypertension Renal insufficiency  Plan GA   Kipp Brood, MD)       Anesthesia Quick Evaluation

## 2011-08-25 NOTE — Preoperative (Signed)
Beta Blockers   Reason not to administer Beta Blockers:Not Applicable 

## 2011-08-25 NOTE — Anesthesia Postprocedure Evaluation (Signed)
  Anesthesia Post-op Note  Patient: [name]  Procedure(s) Performed: Procedure(s) (LRB): MINIMALLY INVASIVE MITRAL VALVE REPAIR (MVR) (Right) MAZE (N/A)  Patient Location: SICU  Anesthesia Type: General  Level of Consciousness: sedated and Patient remains intubated per anesthesia plan  Airway and Oxygen Therapy: Patient remains intubated per anesthesia plan and Patient placed on Ventilator (see vital sign flow sheet for setting)  Post-op Pain: none  Post-op Assessment: Post-op Vital signs reviewed and Patient's Cardiovascular Status Stable  Post-op Vital Signs: stable  Complications: No apparent anesthesia complications

## 2011-08-25 NOTE — OR Nursing (Signed)
13:55pm 1st call to SICU, call made to vol. Desk to inform family off pump.  14:30pm 2nd call made to SICU

## 2011-08-25 NOTE — Op Note (Signed)
CARDIOTHORACIC SURGERY OPERATIVE NOTE  Date of Procedure:  08/25/2011  Preoperative Diagnosis:   Severe Mitral Regurgitation  Recurrent Persistent Atrial Fibrillation  Severe 3-Vessel Coronary Artery Disease s/p CABG x3  S/P Aortic Valve Replacement  Postoperative Diagnosis: Same  Procedure:   Minimally-Invasive Mitral Valve Repair  Complex valvuloplasty including triangular resection of flail posterior commissural leaflet  Goretex Neocord replacement x2  Sorin Memo 3D Ring Annuloplasty (size 28mm, catalog # [id number], serial # [id number])  Maze Procedure  Complete biatrial lesion set  Reoperation s/p Aortic Valve Replacement + CABG x3  Surgeon: Salvatore Decent. 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  Postoperatively the patient developed persistent atrial fibrillation for which he underwent successful cardioversion 3 months later.  He apparently did quite well until recently.  He states that he has always had some mild exertional shortness of breath, but approximately 2 or 3 weeks ago he developed much more severe shortness of breath which now has been occurring with minimal activity and at rest.  He presented to [facility] where he was admitted in atrial fibrillation  with acute on chronic congestive heart failure with pulmonary edema.  Symptoms have improved on medical therapy.  Both transthoracic and transesophageal echocardiograms demonstrate severe mitral regurgitation.  There is severe LV chamber enlargement with moderate to severe LV systolic dysfunction, EF estimated 35%.  The bioprosthetic aortic valve is functioning normally.  The patient was transferred to [facility] for cardiac catheterization and surgical consultation.  Cardiac catheterization revealed the presence of 3 vessel CAD but all previously placed bypass grafts were functioning normally.  There was severe pulmonary hypertension due to severe mitral regurgitation with underlying severe LV dysfunction.  The patient has been seen in consultation and counseled at length regarding the indications, risks and potential benefits of surgery.  All questions have been answered, and the patient provides full informed consent for the operation as described.     DETAILS OF THE OPERATIVE PROCEDURE  The patient is brought to the operating room on the above mentioned date and central monitoring was established by the anesthesia team including placement of Swan-Ganz catheter through the left internal jugular vein.  There was severe pulmonary hypertension with PA pressures > 70 mmHg.  A radial arterial line is placed. The patient is placed in the supine position on the operating table.  Intravenous antibiotics are administered. General endotracheal anesthesia is induced uneventfully. The patient is initially intubated using a dual lumen endotracheal tube.  A Foley catheter is placed.  Baseline transesophageal echocardiogram was performed.  Findings were notable for severe LV dysfunction with global hypokinesis and inferolateral akinesis.  There was a normally functioning bioprosthetic tissue valve in the aortic position.  There was a flail commissural leaflet near the posteromedial commissure with ruptured cords and  severe mitral regurgitation.  A soft roll is placed behind the patient's left scapula and the neck gently  extended and turned to the left.   The patient's right neck, chest, abdomen, both groins, and both lower extremities are prepared and draped in a sterile manner. A time out procedure is performed.  A small incision is made in the right inguinal crease and the anterior surface of the right common femoral artery and right common femoral vein are identified.  A right miniature anterolateral thoracotomy incision is performed. The incision is placed just lateral to and superior to the right nipple. The pectoralis major muscle is retracted medially and completely preserved. The right pleural space is entered through the 4th intercostal space. A soft tissue retractor is placed.  Two 11 mm ports are placed through separate stab incisions inferiorly. The right pleural space is insufflated continuously with carbon dioxide gas through the posterior port during the remainder of the operation.  A pledgeted sutures placed through the dome of the right hemidiaphragm and retracted inferiorly to facilitate exposure.    The patient is placed in Trendelenburg position. The right internal jugular vein is cannulated with Seldinger technique and a guidewire advanced into the right atrium. The patient is heparinized systemically. The right internal jugular vein is cannulated with a 14 [place] pediatric femoral venous cannula. Pursestring sutures are placed on the anterior surface of the right common femoral vein and right common femoral artery. The right common femoral vein is cannulated with the Seldinger technique and a guidewire is advanced under transesophageal echocardiogram guidance through the right atrium. The femoral vein is cannulated with a long 22 French femoral venous cannula. The right common femoral artery is cannulated with Seldinger technique and a flexible guidewire is advanced until it can be appreciated  intraluminally in the descending thoracic aorta on transesophageal echocardiogram. The femoral artery is cannulated with an 18 French femoral arterial cannula.  Adequate heparinization is verified.      The entire pre-bypass portion of the operation was notable for stable hemodynamics with severe pulmonary hypertension.  Single lung ventilation was not tolerated well.  Cardiopulmonary bypass was begun.  Vacuum assist venous drainage is utilized. The pericardium was opened 3 cm anterior to the phrenic nerve. Venous drainage and exposure are notably excellent.  Careful dissection was performed to free up the right atrium and the interatrial groove.  The patent saphenous vein graft was easily located and carefully avoided.  Dissection was continued inferiorly until the posterior surface of the right ventricle was identified.  A bipolar pacing wire was placed into the RV freewall.  The Atricure cryothermy system is utilized for all cryothermy ablation lesions for the Cox cryomaze procedure.  The right atrial lateral wall lesions are placed including a longitudinal line along the lateral wall from the superior vena cava to the inferior vena cava.  A second lesion is then placed extending from the midportion of the first lesion in an anterior and inferior direction to reach the acute margin of the heart.  This was done with care to avoid the patent vein graft to the RCA system.  The patient is cooled to 28C systemic temperature.  Rapid RV pacing was begun until ventricular fibrillation developed.  The entire mitral valve procedure and left side maze procedure were performed under cold fibrillatory arrest.  A left atriotomy incision was performed through the interatrial groove and extended partially across the back wall of the left atrium after opening the oblique sinus inferiorly.  The mitral valve is exposed using a self-retaining retractor.  The mitral valve was inspected and notable for fibroelastic  deficiency with ruptured primary cords to the commissural leaflet at the posteromedial commissure.  The remainder of the valve was normal in appearance .  The left atrial lesion set of the Cox cryomaze procedure is now performed using 3 minute duration for all cryothermy lesions.  Initially a lesion is placed along the endocardial surface of the left atrium from the caudad apex of the atriotomy incision across the posterior wall of the left atrium onto the posterior mitral annulus.  A mirror image lesion along the epicardial surface is then performed with the probe posterior to the left atrium, crossing over the coronary sinus.  Two lesions are then performed to create a box isolating all of the pulmonary veins from the remainder of the left atrium.  The first lesion is placed from the cephalad apex of the atriotomy incision across the dome of the left atrium to just anterior to the left sided pulmonary veins.  The second lesion completes the box from the caudad apex of the atriotomy incision across the back wall of the left atrium to connect with the previous lesion just anterior to the left sided pulmonary veins.    Interrupted 2-0 Ethibond horizontal mattress sutures are placed circumferentially around the entire mitral valve annulus. The sutures will ultimately be utilized for ring annuloplasty, and at this juncture there are utilized to suspend the valve symmetrically.  A pledgeted CV-4 Goretex suture was placed through the head of the posterior papillary muscle and tied.  Each of the two limbs of the suture will later be placed through the P3 segment of the posterior leaflet.  The small flail commissural leaflet was resected using a small triangular resection.  The intervening vertical defect was closed with everting CV-5 Goretex sutures.  The valve was tested with saline and appeared competent.  The valve was sized to accept a 28 mm annuloplasty ring based upon the distance between the anterior and  posterior commissures, the height and the surface area of the anterior leaflet.  A Sorin Memo 3D annuloplasty ring (size 28mm, catalog # [id number], serial # [id number]) was secured in place uneventfully.  The Goretex neocords were now woven through the P3 segment of the posterior leaflet on either side of the repaired defect.  The Goretex sutures were tied while the LV was filled with saline and the valve closed.  The valve is again tested with saline and appears to be perfectly competent with a broad symmetrical line of coaptation of the anterior and posterior leaflet. There is no residual leak. Rewarming is begun.  The atriotomy was closed using a 2-layer closure of running 3-0 Prolene suture after placing a sump drain across the mitral valve to serve as a left ventricular vent.    Epicardial pacing wires are fixed to the right atrial appendage. The patient is rewarmed to 37C temperature. The left ventricular vent is removed.  The patient is ventilated and flow volumes turndown while the mitral valve repair is inspected using transesophageal echocardiogram. The valve repair appears intact with no residual leak. The antegrade cardioplegia cannula is now removed. The patient is weaned and disconnected from cardiopulmonary bypass.  The patient's rhythm at separation from bypass was junctional.  The patient was weaned from bypass on low dose epinephrine, dopamine, milrinone and nitric oxide. Total cardiopulmonary bypass time for the operation was 206 minutes.  Followup transesophageal echocardiogram performed after separation from bypass revealed a well-seated annuloplasty ring in the mitral position with a normal functioning mitral valve. There was no residual  leak.  Left ventricular function was unchanged from preoperatively.  The femoral arterial and venous cannulae were removed uneventfully. There was a palpable pulse in the distal right common femoral artery after removal of the cannula. Protamine was  administered to reverse the anticoagulation. The right internal jugular cannula was removed and manual pressure held on the neck for 15 minutes.  Single lung ventilation was begun. The atriotomy closure was inspected for hemostasis. The pericardial sac was drained using a 28 French Bard drain placed through the anterior port incision.  The pericardium was closed using a patch of core matrix bovine submucosal tissue patch. The right pleural space is irrigated with saline solution and inspected for hemostasis. The right pleural space was drained using a 28 French Bard drain placed through the posterior port incision. The miniature thoracotomy incision was closed in multiple layers in routine fashion. The right groin incision was inspected for hemostasis and closed in multiple layers in routine fashion.  The post-bypass portion of the operation was notable for stable rhythm and hemodynamics.  The patient was transfused 2 packs platelets and 2 units fresh frozen plasma due to coagulapathy and thrombocytopenia after reversal of heparin with protamine.  The patient was transfused 1 unit PRBCs during cardiopulmonary bypass and 1 unit PRBCs after reversal of heparin due to anemia present preoperatively and exacerbated by hemodilution and blood loss intraoperatively.  The patient tolerated the procedure well.  The patient was reintubated using a single lumen endotracheal tube and subsequently transported to the surgical intensive care unit in stable condition. There were no intraoperative complications. All sponge instrument and needle counts are verified correct at completion of the operation.     Salvatore Decent. Cornelius Moras MD 08/25/2011 4:04 PM

## 2011-08-25 NOTE — Brief Op Note (Signed)
08/17/2011 - 08/25/2011  1:18 PM  PATIENT:  [name]  78 y.o. male  PRE-OPERATIVE DIAGNOSIS:  MR  POST-OPERATIVE DIAGNOSIS:  MR  PROCEDURE:  Procedure(s) (LRB):  REOPERATION FOR PREVIOUS CARDIAC SURGERY  MINIMALLY INVASIVE MITRAL VALVE REPAIR  Complex valvuloplasty with triangular resection of flail commissural leaflet  Goretex neocord replacment x2  28 mm Sorin Memo 3D ring annuloplasty  MAZE  Complete biatrial lesion set using cryothermy  SURGEON:    [name], MD  ASSISTANTS:  Lowella Dandy, PA-C  ANESTHESIA:   Kipp Brood, MD  CROSSCLAMP TIME:   0 CARDIOPULMONARY BYPASS TIME: 206'  FINDINGS:  Severe LV dysfunction with severe pulmonary hypertension pre - CPB  Fibroelastic deficiency type degenerative disease with flail commissural leaflet posterior commissure  Type II dysfunction with severe mitral regurgitation  No residual mitral regurgitation following successful valve repair  COMPLICATIONS: none  PATIENT DISPOSITION:   TO SICU IN STABLE CONDITION  [name] 08/25/2011 2:38 PM

## 2011-08-26 ENCOUNTER — Inpatient Hospital Stay (HOSPITAL_COMMUNITY): Payer: Medicare Other

## 2011-08-26 ENCOUNTER — Encounter (HOSPITAL_COMMUNITY): Payer: Self-pay | Admitting: Anesthesiology

## 2011-08-26 LAB — BASIC METABOLIC PANEL
CO2: 26 mEq/L (ref 19–32)
Chloride: 104 mEq/L (ref 96–112)
Glucose, Bld: 106 mg/dL — ABNORMAL HIGH (ref 70–99)
Sodium: 137 mEq/L (ref 135–145)

## 2011-08-26 LAB — POCT I-STAT 3, ART BLOOD GAS (G3+)
Patient temperature: 37.1
Patient temperature: 37.1
TCO2: 27 mmol/L (ref 0–100)
pCO2 arterial: 41.3 mmHg (ref 35.0–45.0)
pCO2 arterial: 37.6 mmHg (ref 35.0–45.0)
pH, Arterial: 7.411 (ref 7.350–7.450)
pH, Arterial: 7.442 (ref 7.350–7.450)

## 2011-08-26 LAB — GLUCOSE, CAPILLARY
Glucose-Capillary: 105 mg/dL — ABNORMAL HIGH (ref 70–99)
Glucose-Capillary: 107 mg/dL — ABNORMAL HIGH (ref 70–99)
Glucose-Capillary: 112 mg/dL — ABNORMAL HIGH (ref 70–99)
Glucose-Capillary: 124 mg/dL — ABNORMAL HIGH (ref 70–99)
Glucose-Capillary: 133 mg/dL — ABNORMAL HIGH (ref 70–99)
Glucose-Capillary: 155 mg/dL — ABNORMAL HIGH (ref 70–99)
Glucose-Capillary: 103 mg/dL — ABNORMAL HIGH (ref 70–99)
Glucose-Capillary: 110 mg/dL — ABNORMAL HIGH (ref 70–99)
Glucose-Capillary: 117 mg/dL — ABNORMAL HIGH (ref 70–99)
Glucose-Capillary: 117 mg/dL — ABNORMAL HIGH (ref 70–99)
Glucose-Capillary: 95 mg/dL (ref 70–99)
Glucose-Capillary: 156 mg/dL — ABNORMAL HIGH (ref 70–99)
Glucose-Capillary: 166 mg/dL — ABNORMAL HIGH (ref 70–99)

## 2011-08-26 LAB — POCT I-STAT, CHEM 8
Glucose, Bld: 139 mg/dL — ABNORMAL HIGH (ref 70–99)
HCT: 25 % — ABNORMAL LOW (ref 39.0–52.0)
Hemoglobin: 8.5 g/dL — ABNORMAL LOW (ref 13.0–17.0)
Potassium: 4.1 mEq/L (ref 3.5–5.1)
Sodium: 138 mEq/L (ref 135–145)

## 2011-08-26 LAB — PREPARE FRESH FROZEN PLASMA: Unit division: 0

## 2011-08-26 LAB — CBC
Hemoglobin: 7.6 g/dL — ABNORMAL LOW (ref 13.0–17.0)
MCH: 31.2 pg (ref 26.0–34.0)
MCHC: 36.2 g/dL — ABNORMAL HIGH (ref 30.0–36.0)
MCV: 86.7 fL (ref 78.0–100.0)
MCV: 86.3 fL (ref 78.0–100.0)
Platelets: 158 10*3/uL (ref 150–400)
Platelets: 110 10*3/uL — ABNORMAL LOW (ref 150–400)
RBC: 2.56 MIL/uL — ABNORMAL LOW (ref 4.22–5.81)
RBC: 2.85 MIL/uL — ABNORMAL LOW (ref 4.22–5.81)
WBC: 18.8 10*3/uL — ABNORMAL HIGH (ref 4.0–10.5)

## 2011-08-26 LAB — PREPARE PLATELET PHERESIS
Unit division: 0
Unit division: 0

## 2011-08-26 LAB — MAGNESIUM: Magnesium: 2.4 mg/dL (ref 1.5–2.5)

## 2011-08-26 MED ORDER — INSULIN ASPART 100 UNIT/ML ~~LOC~~ SOLN
0.0000 [IU] | SUBCUTANEOUS | Status: DC
  Administered 2011-08-26 – 2011-08-27 (×7): 2 [IU] via SUBCUTANEOUS
  Administered 2011-08-27 – 2011-08-28 (×2): 4 [IU] via SUBCUTANEOUS

## 2011-08-26 MED ORDER — INSULIN ASPART 100 UNIT/ML ~~LOC~~ SOLN: 0.0000 [IU] | SUBCUTANEOUS | Status: DC

## 2011-08-26 MED ORDER — FUROSEMIDE 10 MG/ML IJ SOLN
10.0000 mg/h | INTRAVENOUS | Status: AC
  Administered 2011-08-26: 8 mg/h via INTRAVENOUS
  Administered 2011-08-27: 10 mg/h via INTRAVENOUS
  Filled 2011-08-26 (×7): qty 25

## 2011-08-26 NOTE — Procedures (Signed)
Extubation Procedure Note  Patient Details:   Name: Jovani Flury Krumholz DOB: [birth year] MRN: [id number]   Airway Documentation:  Pt extubated to 4L [place] per MD. No stridor noted. BBS equal and dim. Pt able to cough and vocalize name. Positive cuff leak.   Evaluation  O2 sats: stable throughout and currently acceptable Complications: No apparent complications Patient did tolerate procedure well. Bilateral Breath Sounds: Clear;Diminished Suctioning: Airway   Christie Beckers 08/26/2011, 9:07 AM

## 2011-08-26 NOTE — Op Note (Signed)
Intraoperative Transesophageal Echo Report:  [name]  Is a 78 year old male  With a history of congestive heart failure and severe left ventricular dysfunction who underwent aortic valve replacement and coronary artery bypass grafting on 08/15/2007. Approximately 2 to 3 weeks ago he began to develop worsening shortness of breath with exertion and was admitted to [facility] with congestive heart failure and atrial fibrillation. Subsequent evaluation revealed severe mitral regurgitation. He is now scheduled to undergo mitral valve repair or replacement by Dr. Cornelius Moras. Intraoperative transesophageal echocardiography was requested to evaluate the mitral valve, to assist with the repair, and to serve as a monitor for intraoperative volume status.  The patient was brought to the operating room at [facility]  and general anesthesia was induced without difficulty. Following tracheal intubation and oral gastric suctioning, the transesophageal echocardiography probe was inserted into the esophagus without difficulty.'  Impression: Pre-bypass Findings:  1. Mitral Valve:  There was angular dilatation with bileaflet tethering. There was also a flail segment in the region of the posterior medial commissure involving the P3 segment of the posterior leaflet. This resulted in a jet of mitral regurgitation which was judged as moderate to severe.   2. Aortic Valve: There was a bio-prosthetic valve in the aortic position.  Leaflets open normally and there was no aortic insufficiency.  3. Left Ventricle:  The left ventricular cavity was markedly enlarged and measured 7.2 cm at end-diastole at the mid-papillary level in the short axis view. There was diffuse hypokinesis noted. The lateral wall was dyskinetic  In the mid and apical segments. Ejection fraction was estimated at 25%. There was no thrombus noted in the left ventricular apex. There were no aneurysmal segments noted.  4.  Right ventricle: the  right ventricular cavity was of normal size. Assessment of right ventricular contractility was somewhat limited due to acoustic shadowing from the aortic prosthesis. There was no evidence of right ventricular dysfunction.  5.  Tricuspid valve: the tricuspid leaflets appeared normal and there was trace tricuspid insufficiency.  6. Inter-atrial Septum:  The inter-atrial septum was intact without evidence of patent foramen ovale or atrial septal defect by color Doppler or bubble study.  7. Left Atrium:  The left atrial cavity was enlarged and measured 6.8 cm in the superior-inferior  dimension and 5.9 cm in the anterior-posterior dimension.  There was no thrombus  noted in the left atrial cavity or left atrial appendage.  8. Ascending Aorta:  There was increased thickness of the  Wall but there was no aneurysmal dilatation and no severe atheromatous disease noted..  9. Descending Aorta: The  The descending aorta appeared free of significant atheromatous disease and measured 2.6 cm in diameter.  Post-Bypass Findings:   1. Mitral Valve:  There was an annuloplasty ring in the mitral position. The posterior leaflet was fixed and the anterior leaflet was freely mobile. There was no mitral insufficiency noted. Continuous wave Doppler interrogation of the mitral inflow revealed a mean trans mitral gradient of 2 mmHg.  2. Aortic Valve:  The aortic valve appeared unchanged from the pre-bypass study. There was a normally functioning bio-prosthetic valve.  3. Left Ventricle:  There was marked left ventricular dysfunction which appeared unchanged from the pre-bypass study. The ejection fraction was estimated at 25%. There were no new wall motion abnormalities.  4. Right Ventricle:  The right ventricle was of normal size and there was no evidence of right ventricular dysfunction. Evaluation was somewhat limited due to acoustic shadowing from the aortic  and mitral valves.  5. Tricuspid Valve:  The tricuspid  leaflets appeared normal and there was one plus tricuspid insufficiency.

## 2011-08-26 NOTE — Progress Notes (Signed)
   CARDIOTHORACIC SURGERY PROGRESS NOTE   R1 Day Post-Op Procedure(s) (LRB): MINIMALLY INVASIVE MITRAL VALVE REPAIR (MVR) (Right) MAZE (N/A)  Subjective: Looks good.  Awake and alert on vent.  Stable hemodynamics and tolerating wean of nitric oxide without any change in PA pressures or cardiac output.  Objective: Vital signs: BP Readings from Last 1 Encounters:  08/26/11 104/56   Pulse Readings from Last 1 Encounters:  08/26/11 84   Resp Readings from Last 1 Encounters:  08/26/11 21   Temp Readings from Last 1 Encounters:  08/26/11 98.8 F (37.1 C)     Hemodynamics: PAP: (25-39)/(15-27) 35/18 mmHg CO:  [3.8 L/min-4.8 L/min] 4.3 L/min CI:  [1.9 L/min/m2-2.4 L/min/m2] 2.1 L/min/m2  Physical Exam:  Rhythm:   sinus  Breath sounds: Fairly clear  Heart sounds:  RRR, no murmur  Incisions:  Dressings dry and intact  Abdomen:  soft  Extremities:  Warm, well perfused   Intake/Output from previous day: 05/17 0701 - 05/18 0700 In: 9239.7 [I.V.:4872.7; ZOXWR:6045; NG/GT:30; IV Piggyback:1550] Out: 4830 [Urine:2785; Blood:975; Chest Tube:1070] Intake/Output this shift: Total I/O In: 67.9 [I.V.:55.4; Blood:12.5] Out: -   Lab Results:  Basename 08/26/11 0350 08/25/11 2125  WBC 18.8* 20.8*  HGB 7.6* 8.2*  HCT 22.2* 23.2*  PLT 158 169   BMET:  Basename 08/26/11 0350 08/25/11 2125 08/25/11 2124 08/24/11 1030  NA 137 -- 139 --  K 4.1 -- 4.0 --  CL 104 -- 103 --  CO2 26 -- -- 27  GLUCOSE 106* -- 111* --  BUN 19 -- 18 --  CREATININE 1.29 1.20 -- --  CALCIUM 7.9* -- -- 9.8    CBG (last 3)   Basename 08/26/11 0134 08/26/11 0043 08/25/11 2331  GLUCAP 105* 107* 124*   ABG    Component Value Date/Time   PHART 7.411 08/26/2011 0637   HCO3 26.2* 08/26/2011 0637   TCO2 27 08/26/2011 0637   ACIDBASEDEF 2.0 08/21/2011 1551   O2SAT 99.0 08/26/2011 0637   CXR: Stable with mild opacity right lung, improved overnight  Assessment/Plan: S/P Procedure(s) (LRB): MINIMALLY  INVASIVE MITRAL VALVE REPAIR (MVR) (Right) MAZE (N/A)  Doing well POD1 Expected post op acute blood loss anemia, slightly worse overnight Expected post op volume excess, moderate Severe LV dysfunction, hemodynamics stable Preop class IV CHF, pulmonary edema Maintaining NSR so far postop   D/C nitric oxide  Wean vent and extubate  Mobilize post extubation  Start lasix  Wean drips as tolerated   Orly Quimby H 08/26/2011 7:54 AM

## 2011-08-26 NOTE — Progress Notes (Signed)
Pt's gas post CP/PS in rapid wean protocol showed a PO2 of 60. MD Cornelius Moras on floor and made aware- OK to extubate per him  [name]

## 2011-08-27 ENCOUNTER — Inpatient Hospital Stay (HOSPITAL_COMMUNITY): Payer: Medicare Other

## 2011-08-27 LAB — BASIC METABOLIC PANEL
Chloride: 97 mEq/L (ref 96–112)
Creatinine, Ser: 1.58 mg/dL — ABNORMAL HIGH (ref 0.50–1.35)
GFR calc Af Amer: 47 mL/min — ABNORMAL LOW (ref >90)
Potassium: 3.7 mEq/L (ref 3.5–5.1)

## 2011-08-27 LAB — GLUCOSE, CAPILLARY
Glucose-Capillary: 141 mg/dL — ABNORMAL HIGH (ref 70–99)
Glucose-Capillary: 122 mg/dL — ABNORMAL HIGH (ref 70–99)
Glucose-Capillary: 164 mg/dL — ABNORMAL HIGH (ref 70–99)
Glucose-Capillary: 113 mg/dL — ABNORMAL HIGH (ref 70–99)

## 2011-08-27 LAB — CBC
HCT: 24.6 % — ABNORMAL LOW (ref 39.0–52.0)
RDW: 16.5 % — ABNORMAL HIGH (ref 11.5–15.5)
WBC: 18.5 10*3/uL — ABNORMAL HIGH (ref 4.0–10.5)

## 2011-08-27 LAB — TYPE AND SCREEN
ABO/RH(D): O POS
Antibody Screen: NEGATIVE
Unit division: 0
Unit division: 0

## 2011-08-27 MED ORDER — WARFARIN SODIUM 2.5 MG PO TABS
2.5000 mg | ORAL_TABLET | Freq: Every day | ORAL | Status: DC
  Administered 2011-08-27 – 2011-08-31 (×5): 2.5 mg via ORAL
  Filled 2011-08-27 (×6): qty 1

## 2011-08-27 MED ORDER — WARFARIN - PHYSICIAN DOSING INPATIENT
Freq: Every day | Status: DC
  Administered 2011-08-30 – 2011-09-03 (×4)

## 2011-08-27 MED ORDER — ASPIRIN EC 81 MG PO TBEC
81.0000 mg | DELAYED_RELEASE_TABLET | Freq: Every day | ORAL | Status: DC
  Administered 2011-08-28 – 2011-09-04 (×8): 81 mg via ORAL
  Filled 2011-08-27 (×8): qty 1

## 2011-08-27 MED ORDER — POTASSIUM CHLORIDE 10 MEQ/50ML IV SOLN
10.0000 meq | INTRAVENOUS | Status: AC
  Administered 2011-08-27 (×2): 10 meq via INTRAVENOUS
  Filled 2011-08-27: qty 100

## 2011-08-27 MED ORDER — POTASSIUM CHLORIDE 10 MEQ/50ML IV SOLN
10.0000 meq | INTRAVENOUS | Status: AC | PRN
  Administered 2011-08-27 (×3): 10 meq via INTRAVENOUS
  Filled 2011-08-27: qty 100
  Filled 2011-08-27: qty 50

## 2011-08-27 NOTE — Progress Notes (Addendum)
   CARDIOTHORACIC SURGERY PROGRESS NOTE  2 Days Post-Op  S/P Procedure(s) (LRB): MINIMALLY INVASIVE MITRAL VALVE REPAIR (MVR) (Right) MAZE (N/A)  Subjective: No pain.  Feels congested and weak.  No appetite yet.  Objective: Vital signs in last 24 hours: Temp:  [97.5 F (36.4 C)-98.7 F (37.1 C)] 97.9 F (36.6 C) (05/19 0744) Pulse Rate:  [38-83] 67  (05/19 0900) Cardiac Rhythm:  [-] Junctional rhythm (05/19 0744) Resp:  [14-26] 19  (05/19 0900) BP: (96-169)/(55-88) 148/71 mmHg (05/19 0900) SpO2:  [89 %-100 %] 99 % (05/19 0900) Arterial Line BP: (112-160)/(52-64) 160/64 mmHg (05/18 1500) Weight:  [92.6 kg (204 lb 2.3 oz)] 92.6 kg (204 lb 2.3 oz) (05/19 0500)  Physical Exam:  Rhythm:   Sinus with PACs  Breath sounds: Poor inspiration, scattered rhonchi  Heart sounds:  RRR, no murmur  Incisions:  Dressings dry  Abdomen:  Soft, non distended, non tender  Extremities:  Warm, well perfused  Chest tube(s):  450 mL out yesterday   Intake/Output from previous day: 05/18 0701 - 05/19 0700 In: 1657.3 [I.V.:1464.8; Blood:12.5; NG/GT:30; IV Piggyback:150] Out: 2350 [Urine:1900; Chest Tube:450] Intake/Output this shift: Total I/O In: 118.2 [I.V.:68.2; IV Piggyback:50] Out: 425 [Urine:375; Chest Tube:50]  Lab Results:  Basename 08/27/11 0453 08/26/11 1630  WBC 18.5* 15.1*  HGB 8.7* 8.9*  HCT 24.6* 24.6*  PLT 111* 110*   BMET:  Basename 08/27/11 0453 08/26/11 1630 08/26/11 1626 08/26/11 0350  NA 135 -- 138 --  K 3.7 -- 4.1 --  CL 97 -- 100 --  CO2 27 -- -- 26  GLUCOSE 137* -- 139* --  BUN 26* -- 22 --  CREATININE 1.58* 1.38* -- --  CALCIUM 8.1* -- -- 7.9*    CBG (last 3)   Basename 08/27/11 0743 08/27/11 0347 08/26/11 2010  GLUCAP 151* 141* 156*    CXR:  Increased bibasilar atelectasis R>L  Assessment/Plan: S/P Procedure(s) (LRB): MINIMALLY INVASIVE MITRAL VALVE REPAIR (MVR) (Right) MAZE (N/A)  Stable POD2 Maintaining NSR so far with brief episode Afib  overnight Expected post op acute blood loss anemia, mild, stable Expected post op volume excess, moderate, diuresing better on lasix drip Acute on chronic renal insufficiency Postop atelectasis Generalized weakness, deconditioning   Mobilize  Pulm toilet  Continue lasix drip  Continue amiodarone drip  Supplement K+  Start coumadin  Leave tubes in at least one more day   [name] 08/27/2011 9:51 AM

## 2011-08-27 NOTE — Addendum Note (Signed)
Addendum  created 08/27/11 1103 by Kipp Brood, MD   Modules edited:Notes Section

## 2011-08-27 NOTE — Progress Notes (Signed)
Anesthesiology Follow-up  Alert, neuro intact, hemodynamically stablesome R. chest incisional pain. Maintaining SR with PACs on amiodarone. Diuresing on lasix drip, on milrinone, other pressors weaned off.  VS: T- 36.4 BP- 139/75 HR 71 RR 18  O2 Sat 98% on 3L  Glucose 151 BUN/Cr. 26/1.58 K-3.7 H/H 8.7/24.6  Plts- 111,000  CXR: persistent R. airspace disease.  Extubated yesterday  morning at 67:7  78 year old WM S/P minimally invasive MV repair with maze. Severe chronic LV dysfunction and baseline renal insufficiency. Overall doing well considering underlying co-morbidities.  Kipp Brood, MD

## 2011-08-27 NOTE — Progress Notes (Signed)
[facility] BRIEF SICU PROGRESS NOTE  2 Days Post-Op  S/P Procedure(s) (LRB): MINIMALLY INVASIVE MITRAL VALVE REPAIR (MVR) (Right) MAZE (N/A)   Stable day NSR with PAC's Diuresing very well  Plan: Continue current plan  Ramiz Turpin H 08/27/2011 6:11 PM

## 2011-08-28 ENCOUNTER — Encounter (HOSPITAL_COMMUNITY): Payer: Self-pay | Admitting: Thoracic Surgery (Cardiothoracic Vascular Surgery)

## 2011-08-28 ENCOUNTER — Inpatient Hospital Stay (HOSPITAL_COMMUNITY): Payer: Medicare Other

## 2011-08-28 LAB — BASIC METABOLIC PANEL
CO2: 32 mEq/L (ref 19–32)
Chloride: 93 mEq/L — ABNORMAL LOW (ref 96–112)
Glucose, Bld: 117 mg/dL — ABNORMAL HIGH (ref 70–99)
Potassium: 3.2 mEq/L — ABNORMAL LOW (ref 3.5–5.1)
Sodium: 134 mEq/L — ABNORMAL LOW (ref 135–145)

## 2011-08-28 LAB — GLUCOSE, CAPILLARY
Glucose-Capillary: 104 mg/dL — ABNORMAL HIGH (ref 70–99)
Glucose-Capillary: 162 mg/dL — ABNORMAL HIGH (ref 70–99)

## 2011-08-28 LAB — CBC
Hemoglobin: 8.8 g/dL — ABNORMAL LOW (ref 13.0–17.0)
MCH: 30.1 pg (ref 26.0–34.0)
RBC: 2.92 MIL/uL — ABNORMAL LOW (ref 4.22–5.81)
WBC: 15.4 10*3/uL — ABNORMAL HIGH (ref 4.0–10.5)

## 2011-08-28 LAB — PROTIME-INR
INR: 1.2 (ref 0.00–1.49)
Prothrombin Time: 15.5 seconds — ABNORMAL HIGH (ref 11.6–15.2)

## 2011-08-28 MED ORDER — FUROSEMIDE 40 MG PO TABS
40.0000 mg | ORAL_TABLET | Freq: Two times a day (BID) | ORAL | Status: DC
  Administered 2011-08-29 (×2): 40 mg via ORAL
  Filled 2011-08-28 (×5): qty 1

## 2011-08-28 MED ORDER — ENSURE COMPLETE PO LIQD
237.0000 mL | Freq: Three times a day (TID) | ORAL | Status: DC
  Administered 2011-08-28 – 2011-09-04 (×22): 237 mL via ORAL

## 2011-08-28 MED ORDER — POTASSIUM CHLORIDE 10 MEQ/50ML IV SOLN
10.0000 meq | INTRAVENOUS | Status: AC
  Administered 2011-08-28 (×3): 10 meq via INTRAVENOUS

## 2011-08-28 MED ORDER — POTASSIUM CHLORIDE 10 MEQ/50ML IV SOLN
INTRAVENOUS | Status: AC
  Administered 2011-08-28: 10 meq via INTRAVENOUS
  Filled 2011-08-28: qty 50

## 2011-08-28 MED ORDER — CARVEDILOL 3.125 MG PO TABS
3.1250 mg | ORAL_TABLET | Freq: Two times a day (BID) | ORAL | Status: DC
  Administered 2011-08-28 – 2011-08-31 (×5): 3.125 mg via ORAL
  Filled 2011-08-28 (×8): qty 1

## 2011-08-28 MED ORDER — LISINOPRIL 20 MG PO TABS
20.0000 mg | ORAL_TABLET | Freq: Every day | ORAL | Status: DC
  Administered 2011-08-28 – 2011-09-04 (×8): 20 mg via ORAL
  Filled 2011-08-28 (×8): qty 1

## 2011-08-28 MED ORDER — POTASSIUM CHLORIDE CRYS ER 20 MEQ PO TBCR
20.0000 meq | EXTENDED_RELEASE_TABLET | Freq: Two times a day (BID) | ORAL | Status: DC
  Administered 2011-08-29 (×2): 20 meq via ORAL
  Filled 2011-08-28 (×4): qty 1

## 2011-08-28 MED ORDER — AMIODARONE HCL 200 MG PO TABS
200.0000 mg | ORAL_TABLET | Freq: Two times a day (BID) | ORAL | Status: DC
  Administered 2011-08-28 – 2011-09-04 (×15): 200 mg via ORAL
  Filled 2011-08-28 (×16): qty 1

## 2011-08-28 MED ORDER — SODIUM CHLORIDE 0.9 % IJ SOLN
3.0000 mL | Freq: Two times a day (BID) | INTRAMUSCULAR | Status: DC
  Administered 2011-08-28 – 2011-09-03 (×10): 3 mL via INTRAVENOUS

## 2011-08-28 MED ORDER — MOVING RIGHT ALONG BOOK
Freq: Once | Status: AC
  Administered 2011-08-28: 08:00:00
  Filled 2011-08-28: qty 1

## 2011-08-28 MED ORDER — POTASSIUM CHLORIDE 10 MEQ/50ML IV SOLN
10.0000 meq | INTRAVENOUS | Status: DC | PRN
  Administered 2011-08-28 (×2): 10 meq via INTRAVENOUS
  Filled 2011-08-28: qty 200
  Filled 2011-08-28: qty 50

## 2011-08-28 MED ORDER — SODIUM CHLORIDE 0.9 % IJ SOLN
3.0000 mL | INTRAMUSCULAR | Status: DC | PRN
  Administered 2011-08-28: 3 mL via INTRAVENOUS

## 2011-08-28 MED ORDER — TRAMADOL HCL 50 MG PO TABS
50.0000 mg | ORAL_TABLET | ORAL | Status: DC | PRN
  Administered 2011-08-28 – 2011-09-01 (×6): 100 mg via ORAL
  Filled 2011-08-28 (×6): qty 2

## 2011-08-28 MED ORDER — SODIUM CHLORIDE 0.9 % IV SOLN: 250.0000 mL | INTRAVENOUS | Status: DC | PRN

## 2011-08-28 MED FILL — Potassium Chloride Inj 2 mEq/ML: INTRAVENOUS | Qty: 40 | Status: AC

## 2011-08-28 MED FILL — Magnesium Sulfate Inj 50%: INTRAMUSCULAR | Qty: 10 | Status: AC

## 2011-08-28 NOTE — Progress Notes (Signed)
   CARDIOTHORACIC SURGERY PROGRESS NOTE  3 Days Post-Op  S/P Procedure(s) (LRB): MINIMALLY INVASIVE MITRAL VALVE REPAIR (MVR) (Right) MAZE (N/A)  Subjective: Feels better.  No pain.  Breathing comfortably.  Poor appetite.  Objective: Vital signs in last 24 hours: Temp:  [97.9 F (36.6 C)-98.3 F (36.8 C)] 97.9 F (36.6 C) (05/20 0739) Pulse Rate:  [59-76] 63  (05/20 0700) Cardiac Rhythm:  [-] Junctional rhythm (05/20 0400) Resp:  [15-21] 16  (05/20 0700) BP: (127-172)/(62-99) 147/79 mmHg (05/20 0700) SpO2:  [97 %-100 %] 100 % (05/20 0700)  Physical Exam:  Rhythm:   sinus  Breath sounds: Fairly clear  Heart sounds:  RRR no murmur  Incisions:  Clean and dry  Abdomen:  Soft, non tender  Extremities:  Warm, well perfused  Chest tubes:  240 mL total yesterday   Intake/Output from previous day: 05/19 0701 - 05/20 0700 In: 1254.5 [I.V.:904.5; IV Piggyback:350] Out: 5315 [Urine:5075; Chest Tube:240] Intake/Output this shift: Total I/O In: -  Out: 200 [Urine:200]  Lab Results:  Basename 08/28/11 0400 08/27/11 0453  WBC 15.4* 18.5*  HGB 8.8* 8.7*  HCT 25.3* 24.6*  PLT 103* 111*   BMET:  Basename 08/28/11 0400 08/27/11 0453  NA 134* 135  K 3.2* 3.7  CL 93* 97  CO2 32 27  GLUCOSE 117* 137*  BUN 25* 26*  CREATININE 1.45* 1.58*  CALCIUM 8.4 8.1*    CBG (last 3)   Basename 08/28/11 0358 08/27/11 2327 08/27/11 1948  GLUCAP 168* 113* 164*   PT/INR:   Basename 08/28/11 0400  LABPROT 15.5*  INR 1.20    CXR:  n/a  Assessment/Plan: S/P Procedure(s) (LRB): MINIMALLY INVASIVE MITRAL VALVE REPAIR (MVR) (Right) MAZE (N/A)  Doing very well POD3 Expected post op acute blood loss anemia, mild, stable Expected post op volume excess, diuresing very well Hypokalemia due to aggressive diuresis Maintaining NSR so far postop Chest tube output decreasing nicely Underlying severe LV dysfunction HTN CRI - stable at baseline   Mobilize  Restart ACE-I  Leave  chest tubes one more day  Transfer step down  Convert amiodarone to oral  Add dietary supplements   Marquia Costello H 08/28/2011 7:45 AM

## 2011-08-28 NOTE — Progress Notes (Signed)
Physical Therapy Cancellation Note: order received and chart reviewed. Attempted to see pt 3x today. Unable due to pt moving rooms, had just ambulated with cardiac rehab, and currently is eating dinner. Will defer to next date. Thanks [name], PT [phone]

## 2011-08-28 NOTE — Progress Notes (Addendum)
Discussed with Dr. Cornelius Moras removal of foley and central line.  Foley to be removed today after lasix drip has stopped, Ok to remove central line after KCL runs have completed.  This information passed on in report.

## 2011-08-28 NOTE — Progress Notes (Signed)
CARDIAC REHAB PHASE I   PRE:  Rate/Rhythm: 66SR  BP:  Supine:   Sitting: 115/54  Standing:    SaO2: 97%3L  MODE:  Ambulation: 240 ft   POST:  Rate/Rhythem: 72  BP:  Supine:   Sitting: 111/52  Standing:    SaO2: 92%3L 1345-1412 Pt walked 240 ft on 3L with rolling walker and asst x 2 with fairly steady gait. Tired easily. To chair after walk. Will keep as asst x 2 until some equipment d/ced.  Duanne Limerick

## 2011-08-28 NOTE — Progress Notes (Signed)
D/C'd patients central line per MD order and hospital policy.  Patient tolerated well.  Dressing applied.  Will continue to monitor.

## 2011-08-29 LAB — BASIC METABOLIC PANEL
Calcium: 8.7 mg/dL (ref 8.4–10.5)
Chloride: 96 mEq/L (ref 96–112)
Creatinine, Ser: 1.38 mg/dL — ABNORMAL HIGH (ref 0.50–1.35)
GFR calc Af Amer: 55 mL/min — ABNORMAL LOW (ref >90)
Sodium: 137 mEq/L (ref 135–145)

## 2011-08-29 LAB — PROTIME-INR: Prothrombin Time: 16.1 seconds — ABNORMAL HIGH (ref 11.6–15.2)

## 2011-08-29 LAB — CBC
MCH: 30.4 pg (ref 26.0–34.0)
MCV: 89.1 fL (ref 78.0–100.0)
Platelets: 123 10*3/uL — ABNORMAL LOW (ref 150–400)
RDW: 16.1 % — ABNORMAL HIGH (ref 11.5–15.5)
WBC: 12.1 10*3/uL — ABNORMAL HIGH (ref 4.0–10.5)

## 2011-08-29 MED ORDER — WARFARIN VIDEO
Freq: Once | Status: AC
Start: 2011-08-29 — End: 2011-08-29
  Administered 2011-08-29: 11:00:00

## 2011-08-29 MED FILL — Heparin Sodium (Porcine) Inj 1000 Unit/ML: INTRAMUSCULAR | Qty: 10 | Status: AC

## 2011-08-29 MED FILL — Heparin Sodium (Porcine) Inj 1000 Unit/ML: INTRAMUSCULAR | Qty: 30 | Status: AC

## 2011-08-29 MED FILL — Lidocaine HCl IV Inj 20 MG/ML: INTRAVENOUS | Qty: 5 | Status: AC

## 2011-08-29 MED FILL — Sodium Bicarbonate IV Soln 8.4%: INTRAVENOUS | Qty: 50 | Status: AC

## 2011-08-29 MED FILL — Mannitol IV Soln 20%: INTRAVENOUS | Qty: 500 | Status: AC

## 2011-08-29 MED FILL — Sodium Chloride IV Soln 0.9%: INTRAVENOUS | Qty: 1000 | Status: AC

## 2011-08-29 MED FILL — Sodium Chloride Irrigation Soln 0.9%: Qty: 3000 | Status: AC

## 2011-08-29 MED FILL — Electrolyte-R (PH 7.4) Solution: INTRAVENOUS | Qty: 5000 | Status: AC

## 2011-08-29 NOTE — Evaluation (Signed)
Physical Therapy Evaluation Patient Details Name: [name] MRN: [id number] DOB: 1931-04-14 Today's Date: 08/29/2011 Time: 4098-1191 PT Time Calculation (min): 25 min  PT Assessment / Plan / Recommendation Clinical Impression  Pt admitted for minimally invasive MVR and maze. Pt progressing well with mobility and will benefit from acute therapy to maximize function prior to discharge to decrease burden of care. Pt educated for limited RUE use and is eager to return to PLOF.    PT Assessment  Patient needs continued PT services    Follow Up Recommendations  Home health PT    Barriers to Discharge None      lEquipment Recommendations  None recommended by PT    Recommendations for Other Services     Frequency Min 3X/week    Precautions / Restrictions Precautions Precaution Comments: chest tube, limited RUE use due to minimal MVR   Pertinent Vitals/Pain HR 62-65 throughout tx O2 90-94 % on 3L      Mobility  Bed Mobility Bed Mobility: Rolling Right;Right Sidelying to Sit;Sit to Supine Rolling Right: 5: Supervision Right Sidelying to Sit: 5: Supervision;HOB flat Sit to Supine: 6: Modified independent (Device/Increase time);HOB flat Details for Bed Mobility Assistance: cueing for sequence of transfer to ease transition and decrease RUE strain Transfers Transfers: Sit to Stand;Stand to [employer] Transfers Sit to Stand: 5: Supervision;From bed;From toilet;From chair/3-in-1 Stand to Sit: 5: Supervision;To toilet;To bed;To chair/3-in-1 Stand Pivot Transfers: 5: Supervision Details for Transfer Assistance: cueing for RUE placement and sequence Ambulation/Gait Ambulation/Gait Assistance: 5: Supervision Ambulation Distance (Feet): 150 Feet Assistive device: Rolling walker Ambulation/Gait Assistance Details: cueing for posture and to step into RW Gait Pattern: Step-through pattern;Decreased stride length Stairs: No    Exercises     PT Diagnosis: Abnormality of gait    PT Problem List: Decreased activity tolerance;Decreased mobility;Decreased knowledge of use of DME PT Treatment Interventions: Gait training;Stair training;DME instruction;Functional mobility training;Therapeutic activities;Therapeutic exercise;Patient/family education   PT Goals Acute Rehab PT Goals PT Goal Formulation: With patient Time For Goal Achievement: 09/05/11 Potential to Achieve Goals: Good Pt will go Supine/Side to Sit: with modified independence PT Goal: Supine/Side to Sit - Progress: Goal set today Pt will go Sit to Supine/Side: with modified independence PT Goal: Sit to Supine/Side - Progress: Goal set today Pt will go Sit to Stand: with modified independence PT Goal: Sit to Stand - Progress: Goal set today Pt will go Stand to Sit: with modified independence PT Goal: Stand to Sit - Progress: Goal set today Pt will Ambulate: >150 feet;with least restrictive assistive device;with modified independence PT Goal: Ambulate - Progress: Goal set today Pt will Go Up / Down Stairs: 1-2 stairs;with supervision;with least restrictive assistive device PT Goal: Up/Down Stairs - Progress: Goal set today  Visit Information  Last PT Received On: 08/29/11 Assistance Needed: +1    Subjective Data  Subjective: i'm just nauseated this morning 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   Prior [employer] Living Lives With: Son Available Help at Discharge: Family;Available 24 hours/day Type of Home: House Home Access: Stairs to enter [employer] of Steps: 1 Entrance Stairs-Rails: None Home Layout: One level Bathroom Shower/Tub: Engineer, manufacturing systems: Standard Home Adaptive Equipment: Bedside commode/3-in-1;Walker - rolling;Straight cane;Shower chair with back Prior Function Level of Independence: Needs assistance Needs Assistance: Light Housekeeping;Meal Prep Meal Prep: Maximal Light Housekeeping: Maximal Able to Take Stairs?: Yes Driving:  Yes Vocation: Retired Comments: Pt independent with basic ADLs, transfers and gait Communication Communication: No difficulties  Cognition  Overall Cognitive Status: Appears within functional limits for tasks assessed/performed Arousal/Alertness: Awake/alert Orientation Level: Appears intact for tasks assessed Behavior During Session: [facility] for tasks performed    Extremity/Trunk Assessment Right Lower Extremity Assessment RLE ROM/Strength/Tone: [facility] for tasks assessed Left Lower Extremity Assessment LLE ROM/Strength/Tone: [facility] for tasks assessed   Balance    End of Session PT - End of Session Activity Tolerance: Patient tolerated treatment well Patient left: in chair;with call bell/phone within reach Nurse Communication: Mobility status   [name] 08/29/2011, 8:25 AM  [name], PT [phone]

## 2011-08-29 NOTE — Progress Notes (Signed)
CARDIAC REHAB PHASE I   PRE:  Rate/Rhythm: 64 SR    BP: sitting 121/66    SaO2: 93 2L   MODE:  Ambulation: 350 ft   POST:  Rate/Rhythm: 70    BP: sitting 113/79     SaO2: 80 2L, 93 3L  Tolerated fairly well with RW, assist x1. At half way c/o wooziness. SaO2 only 80%. Had pt on 2L but only one cannula in nostril. Up to 90 on 3L and with bilateral cannulas. Pt felt better on 3L. SaO2 difficult to register after walk, then was 93 3L after several minutes of rest. To bed so to pull CT. Will f/u. Encouraged IS and sitting in chair later.  [id number]  Harriet Masson CES, ACSM

## 2011-08-29 NOTE — Progress Notes (Signed)
Removed Y site chest tube.  Applied vaseline occlusive dressing and 4x4 gauze. Pt was premedicated with 2 ultram and tolerated the procedure well.  Pt to remain in bed and stat portable chest xray has been ordered.  No s/sx bleeding. Pt resting at this time.  Will continue to monitor. Thomas Hoff

## 2011-08-29 NOTE — Progress Notes (Signed)
D/C pts EPW per MD order and hospital policy; pt tolerated well; will cont. To monitor; pt bedrest until 1200; no bleeding noted; dressing applied.

## 2011-08-29 NOTE — Progress Notes (Addendum)
[address]            [place] [address]          [phone]     4 Days Post-Op  Procedure(s) (LRB): MINIMALLY INVASIVE MITRAL VALVE REPAIR (MVR) (Right) MAZE (N/A) Subjective: Had some mild nausea last night, otherwise feels ok  Objective  Telemetry sinus, occas pvc's   Temp:  [97.7 F (36.5 C)-98.9 F (37.2 C)] 98.4 F (36.9 C) (05/21 0556) Pulse Rate:  [58-64] 58  (05/21 0556) Resp:  [15-22] 18  (05/21 0556) BP: (117-159)/(59-95) 117/67 mmHg (05/21 0556) SpO2:  [87 %-100 %] 100 % (05/21 0556)   Intake/Output Summary (Last 24 hours) at 08/29/11 0749 Last data filed at 08/29/11 9147  Gross per 24 hour  Intake  270.1 ml  Output   3065 ml  Net -2794.9 ml       General appearance: alert, cooperative and no distress Heart: regular rate and rhythm, S1, S2 normal and occas extrasystole Lungs: clear to auscultation bilaterally Abdomen: soft, nontender, + BS Extremities: min edema Wound: dressings CDI  Lab Results:  Basename 08/29/11 0515 08/28/11 0400 08/26/11 1630  NA 137 134* --  K 4.2 3.2* --  CL 96 93* --  CO2 36* 32 --  GLUCOSE 131* 117* --  BUN 27* 25* --  CREATININE 1.38* 1.45* --  CALCIUM 8.7 8.4 --  MG -- -- 2.1  PHOS -- -- --   No results found for this basename: AST:2,ALT:2,ALKPHOS:2,BILITOT:2,PROT:2,ALBUMIN:2 in the last 72 hours No results found for this basename: LIPASE:2,AMYLASE:2 in the last 72 hours  Basename 08/29/11 0515 08/28/11 0400  WBC 12.1* 15.4*  NEUTROABS -- --  HGB 8.9* 8.8*  HCT 26.1* 25.3*  MCV 89.1 86.6  PLT 123* 103*   No results found for this basename: CKTOTAL:4,CKMB:4,TROPONINI:4 in the last 72 hours No components found with this basename: POCBNP:3 No results found for this basename: DDIMER in the last 72 hours No results found for this basename: HGBA1C in the last 72 hours No results found for this basename: CHOL,HDL,LDLCALC,TRIG,CHOLHDL in the last 72 hours No results found  for this basename: TSH,T4TOTAL,FREET3,T3FREE,THYROIDAB in the last 72 hours No results found for this basename: VITAMINB12,FOLATE,FERRITIN,TIBC,IRON,RETICCTPCT in the last 72 hours  Medications: Scheduled    . acetaminophen  1,000 mg Oral Q6H  . amiodarone  200 mg Oral BID  . aspirin EC  81 mg Oral Daily  . bisacodyl  10 mg Oral Daily   Or  . bisacodyl  10 mg Rectal Daily  . carvedilol  3.125 mg Oral BID WC  . docusate sodium  200 mg Oral Daily  . feeding supplement  237 mL Oral TID PC & HS  . furosemide  40 mg Oral BID  . lisinopril  20 mg Oral Daily  . moving right along book   Does not apply Once  . pantoprazole  40 mg Oral Q1200  . potassium chloride  10 mEq Intravenous Q1 Hr x 3  . potassium chloride  20 mEq Oral BID  . sodium chloride  3 mL Intravenous Q12H  . warfarin  2.5 mg Oral q1800  . Warfarin - Physician Dosing Inpatient   Does not apply q1800  . DISCONTD: acetaminophen (TYLENOL) oral liquid 160 mg/5 mL  975 mg Per Tube Q6H  . DISCONTD: insulin aspart  0-24 Units Subcutaneous Q4H  . DISCONTD: metoprolol tartrate  12.5 mg Per Tube BID  . DISCONTD: metoprolol tartrate  12.5 mg Oral BID  . DISCONTD: sodium chloride  3 mL Intravenous Q12H     Radiology/Studies:  Dg Chest Port 1 View  08/28/2011  *RADIOLOGY REPORT*  Clinical Data: Evaluate atelectasis  PORTABLE CHEST - 1 VIEW  Comparison: 08/27/2011; 08/26/2011; 08/25/2011  Findings: Grossly unchanged enlarged cardiac silhouette post median sternotomy, CABG and aortic valve repair. Stable position of support apparatus.  There is likely a very tiny right apical pneumothorax.  Slight improved aeration of the right lung with persistent right mid predominate heterogeneous opacities.  Left hemithorax is unchanged.  Unchanged bones.  IMPRESSION: 1.  Stable position of support apparatus.  Likely tiny right apical pneumothorax. 2.  Overall improved aeration of the right lung with persistent right mid lung atelectasis versus  contusion.  Original Report Authenticated By: Waynard Reeds, M.D.    INR:1.26 Will add last result for INR, ABG once components are confirmed Will add last 4 CBG results once components are confirmed  Assessment/Plan: S/P Procedure(s) (LRB): MINIMALLY INVASIVE MITRAL VALVE REPAIR (MVR) (Right) MAZE (N/A) cont rehab/pulm toilet Cont ace at current dose,creat improved, bp improving Ct 130 cc yesterday- d/c today Rhythm - monitor on current rx H/H stable AC RX Cont diuresis - may be able to decrease soon  LOS: 12 days    GOLD,WAYNE E 5/21/20137:49 AM    I have seen and examined the patient and agree with the assessment and plan as outlined.  Kani Jobson H 08/29/2011 8:36 AM

## 2011-08-30 ENCOUNTER — Inpatient Hospital Stay (HOSPITAL_COMMUNITY): Payer: Medicare Other

## 2011-08-30 LAB — PROTIME-INR
INR: 1.31 (ref 0.00–1.49)
Prothrombin Time: 16.5 seconds — ABNORMAL HIGH (ref 11.6–15.2)

## 2011-08-30 MED ORDER — AMIODARONE HCL 200 MG PO TABS: 200.0000 mg | ORAL_TABLET | Freq: Two times a day (BID) | ORAL | Status: DC

## 2011-08-30 MED ORDER — FUROSEMIDE 40 MG PO TABS
40.0000 mg | ORAL_TABLET | Freq: Every day | ORAL | Status: DC
  Administered 2011-08-30 – 2011-09-01 (×3): 40 mg via ORAL
  Filled 2011-08-30 (×3): qty 1

## 2011-08-30 MED ORDER — POTASSIUM CHLORIDE CRYS ER 20 MEQ PO TBCR
20.0000 meq | EXTENDED_RELEASE_TABLET | Freq: Every day | ORAL | Status: DC
  Administered 2011-08-30 – 2011-09-04 (×6): 20 meq via ORAL
  Filled 2011-08-30 (×5): qty 1

## 2011-08-30 MED ORDER — TRAMADOL HCL 50 MG PO TABS: 50.0000 mg | ORAL_TABLET | ORAL | Status: AC | PRN

## 2011-08-30 MED ORDER — FUROSEMIDE 40 MG PO TABS: 40.0000 mg | ORAL_TABLET | Freq: Every day | ORAL | Status: DC

## 2011-08-30 MED ORDER — POTASSIUM CHLORIDE CRYS ER 20 MEQ PO TBCR: 20.0000 meq | EXTENDED_RELEASE_TABLET | Freq: Every day | ORAL | Status: DC

## 2011-08-30 MED ORDER — WARFARIN SODIUM 2.5 MG PO TABS: 2.5000 mg | ORAL_TABLET | Freq: Every day | ORAL | Status: DC

## 2011-08-30 NOTE — Progress Notes (Addendum)
[address]            [place] [address]          [phone]     5 Days Post-Op  Procedure(s) (LRB): MINIMALLY INVASIVE MITRAL VALVE REPAIR (MVR) (Right) MAZE (N/A) Subjective: conts to feel stronger, + productive sputum  Objective  Telemetry SR, PVC's  Temp:  [98.4 F (36.9 C)-98.5 F (36.9 C)] 98.5 F (36.9 C) (05/22 0447) Pulse Rate:  [62-66] 66  (05/22 0447) Resp:  [19] 19  (05/22 0447) BP: (121-141)/(58-91) 141/91 mmHg (05/22 0447) SpO2:  [90 %-100 %] 99 % (05/22 0447) Weight:  [189 lb 13.1 oz (86.1 kg)] 189 lb 13.1 oz (86.1 kg) (05/22 0528)   Intake/Output Summary (Last 24 hours) at 08/30/11 0749 Last data filed at 08/30/11 0700  Gross per 24 hour  Intake    600 ml  Output   1100 ml  Net   -500 ml       General appearance: alert, cooperative and no distress Heart: regular rate and rhythm, S1, S2 normal and soft systolic murmur Lungs: diminished Right base Abdomen: soft, nontender, + BS Extremities: no edema LE's Wound: incisions healing well   Lab Results:  Basename 08/29/11 0515 08/28/11 0400  NA 137 134*  K 4.2 3.2*  CL 96 93*  CO2 36* 32  GLUCOSE 131* 117*  BUN 27* 25*  CREATININE 1.38* 1.45*  CALCIUM 8.7 8.4  MG -- --  PHOS -- --   No results found for this basename: AST:2,ALT:2,ALKPHOS:2,BILITOT:2,PROT:2,ALBUMIN:2 in the last 72 hours No results found for this basename: LIPASE:2,AMYLASE:2 in the last 72 hours  Basename 08/29/11 0515 08/28/11 0400  WBC 12.1* 15.4*  NEUTROABS -- --  HGB 8.9* 8.8*  HCT 26.1* 25.3*  MCV 89.1 86.6  PLT 123* 103*   No results found for this basename: CKTOTAL:4,CKMB:4,TROPONINI:4 in the last 72 hours No components found with this basename: POCBNP:3 No results found for this basename: DDIMER in the last 72 hours No results found for this basename: HGBA1C in the last 72 hours No results found for this basename: CHOL,HDL,LDLCALC,TRIG,CHOLHDL in the last 72 hours No  results found for this basename: TSH,T4TOTAL,FREET3,T3FREE,THYROIDAB in the last 72 hours No results found for this basename: VITAMINB12,FOLATE,FERRITIN,TIBC,IRON,RETICCTPCT in the last 72 hours  Medications: Scheduled    . acetaminophen  1,000 mg Oral Q6H  . amiodarone  200 mg Oral BID  . aspirin EC  81 mg Oral Daily  . bisacodyl  10 mg Oral Daily   Or  . bisacodyl  10 mg Rectal Daily  . carvedilol  3.125 mg Oral BID WC  . docusate sodium  200 mg Oral Daily  . feeding supplement  237 mL Oral TID PC & HS  . furosemide  40 mg Oral BID  . lisinopril  20 mg Oral Daily  . pantoprazole  40 mg Oral Q1200  . potassium chloride  20 mEq Oral BID  . sodium chloride  3 mL Intravenous Q12H  . warfarin  2.5 mg Oral q1800  . warfarin   Does not apply Once  . Warfarin - Physician Dosing Inpatient   Does not apply q1800     Radiology/Studies:  Dg Chest Port 1 View  08/28/2011  *RADIOLOGY REPORT*  Clinical Data: Evaluate atelectasis  PORTABLE CHEST - 1 VIEW  Comparison: 08/27/2011; 08/26/2011; 08/25/2011  Findings: Grossly unchanged enlarged cardiac silhouette  post median sternotomy, CABG and aortic valve repair. Stable position of support apparatus.  There is likely a very tiny right apical pneumothorax.  Slight improved aeration of the right lung with persistent right mid predominate heterogeneous opacities.  Left hemithorax is unchanged.  Unchanged bones.  IMPRESSION: 1.  Stable position of support apparatus.  Likely tiny right apical pneumothorax. 2.  Overall improved aeration of the right lung with persistent right mid lung atelectasis versus contusion.  Original Report Authenticated By: Waynard Reeds, M.D.    INR:  Will add last result for INR, ABG once components are confirmed Will add last 4 CBG results once components are confirmed  Assessment/Plan: S/P Procedure(s) (LRB): MINIMALLY INVASIVE MITRAL VALVE REPAIR (MVR) (Right) MAZE (N/A) Conts to progress nicely Wean O2 off BP with  overall good control except one reading 141/91cont current RX Cont AC RX- INR pending Decrease lasix/ K+ to daily  LOS: 13 days    GOLD,WAYNE E 5/22/20137:49 AM    I have seen and examined the patient and agree with the assessment and plan as outlined.  Nandan Willems H 08/30/2011 9:57 AM

## 2011-08-30 NOTE — Progress Notes (Addendum)
CARDIAC REHAB PHASE I   PRE:  Rate/Rhythm: 66SR  BP:  Supine:   Sitting: 124/72  Standing:    SaO2: 99%2L,93-95%RA  MODE:  Ambulation: 550 ft   POST:  Rate/Rhythem: 71SR  BP:  Supine:   Sitting: 139/72  Standing:    SaO2: 85%RA hall, put on 2L to keep sats 89-90% 0935-1007 Pt walked to 2014 from room and sats to 85%RA. Had pt sit in chair while oxygen obtained. Pt's sats to 89-90% on 2L. Pt walked the rest of walk on 2L with rolling walker and asst x 1. Coughed productively once. Left on 2L in room and had pt demonstrate IS.  Walked 550 ft total. Pt stated he felt SOB whether on oxygen or not.   Duanne Limerick SATURATION QUALIFICATIONS:  Patient Saturations on Room Air at Rest = 93%  Patient Saturations on [employer] while Ambulating = 85%  Patient Saturations on 2 Liters of oxygen while Ambulating = 89-90%

## 2011-08-30 NOTE — Discharge Summary (Addendum)
[address]            [place] [address]          [phone]         Discharge Summary  Name: [name] DOB: 1931/06/05 78 y.o. MRN: [id number]  Admission Date: 08/17/2011 Discharge Date:    Admitting Diagnosis:  Acute on Chronic CHF  Persistent atrial fibrillation    Discharge Diagnosis:  Patient Active Problem List  Diagnoses  . HYPERLIPIDEMIA  . S/P aortic valve replacement with bioprosthetic valve  . S/P CABG x 3  . Atrial fibrillation, persistent  . Chronic kidney disease (CKD)  . Coronary artery disease  . Hypertension  . Peptic ulcer disease  . S/P partial gastrectomy  . Severe mitral regurgitation  . Acute on chronic systolic CHF (congestive heart failure)   E Expected postoperative blood loss anemia  . Ischemic cardiomyopathy      Procedures: Procedure(s):  MINIMALLY INVASIVE MITRAL VALVEAnd a full REPAIR (Complex valvuloplasty including triangular resection of flail posterior commissural leaflet, Goretex Neocord replacement x2  Sorin Memo 3D Ring Annuloplasty size 28mm)  MAZE PROCEDURE (complete by atrial lesion set) - 08/25/2011   HPI:  The patient is a 78 y.o. male with history of congestive heart failure and severe left ventricular dysfunction who underwent aortic valve replacement and CABG x3 on 08/15/2007 for bicuspid aortic valve disease with severe aortic insufficiency and 3-vessel CAD. Postoperatively the patient developed persistent atrial fibrillation for which he underwent successful cardioversion 3 months later. He apparently did quite well until recently. He states that he has always had some mild exertional shortness of breath, but approximately 2 or 3 weeks ago he developed much more severe shortness of breath which now has been occurring with minimal activity and at rest. He presented to [facility] where he was admitted in atrial fibrillation with acute on chronic congestive heart failure  with pulmonary edema. Symptoms have improved on medical therapy. Both transthoracic and transesophageal echocardiograms demonstrate severe mitral regurgitation. There is severe LV chamber enlargement with moderate to severe LV systolic dysfunction, EF estimated 35%. The bioprosthetic aortic valve is functioning normally. The patient was transferred to [facility] for cardiac catheterization and surgical consultation.    Hospital Course:  The patient was admitted to [facility] on 08/17/2011.  cardiac surgery consult was requested and Dr. Cornelius Moras saw the patient. He felt that his CHF was due to likely recent ruptured cords of the mitral valve with severe mitral regurgitation. Left and right heart catheterization was recommended prior to proceeding with any surgical intervention to his valve. This was performed on 08/21/2011 by Dr. Riley Kill. This revealed that his previous bypass grafts were widely patent. He was noted to have pulmonary hypertension. Dr. Cornelius Moras felt that he would benefit from mitral valve repair and concomitant maze procedure through a minimally invasive approach.  All risks, benefits and alternatives of surgery were explained in detail, and the patient agreed to proceed. The patient was taken to the operating room and underwent the above procedure.    The postoperative course has generally been uneventful. He was allowed to slowly wean from the vent and was extubated on postop day one without difficulty. He has been volume overloaded and initially was treated with a Lasix drip. He diuresed well and has now been switched to a by mouth dose of Lasix. He was started on Coumadin  and his INR is trending upward appropriately. He was transferred to the stepdown unit on postop day 3 and has been progressing with rehabilitation modalities. He has had a mild postoperative blood loss anemia which has not required transfusion. He is generally maintaining normal sinus rhythm and his blood pressures have been stable  on his home antihypertensive medications. His chest tubes have been removed and his followup chest x-ray showed only a tiny right apical pneumothorax. His renal function has remained stable.  POD #7 it was originally planned to discharge the patient home.  However, he began to complain of wheezing with ambulation.  It was felt this was most likely associated with CHF.  He was subsequently treated with IV Lasix. BNP and BMET were also obtained.  POD #8 the patients BNP was elevated >6000.  He continued to be diuresed with lasix, but continued to require oxygen.  POD #9 the patient states he is breathing better.  His chest xray shows some continued atelectasis and some pleural fluid on the right.  The patient states he is breathing better, however he continues to require oxygen.  Home oxygen will be arranged and we will plan for d/c tomorrow 09/04/2011 pending no further issues arise.     Recent vital signs:  Filed Vitals:   09/03/11 1002  BP: 120/68  Pulse:   Temp:   Resp:     Recent laboratory studies:  CBC:  Basename 09/01/11 0437  WBC 9.2  HGB 9.4*  HCT 28.2*  PLT 202   BMET:   Basename 09/02/11 0545 09/01/11 0437  NA 136 136  K 4.2 5.1  CL 93* 95*  CO2 35* 35*  GLUCOSE 121* 110*  BUN 23 20  CREATININE 1.37* 1.22  CALCIUM 9.3 9.1    PT/INR:   Basename 09/03/11 0440  LABPROT 24.7*  INR 2.19*    Discharge Medications:   Medication List  As of 09/03/2011 12:17 PM   STOP taking these medications         BC HEADACHE POWDER PO      nitroGLYCERIN 0.4 MG SL tablet         TAKE these medications         amiodarone 200 MG tablet   Commonly known as: PACERONE   Take 1 tablet (200 mg total) by mouth 2 (two) times daily.      aspirin EC 81 MG tablet   Take 81 mg by mouth daily.      carvedilol 6.25 MG tablet   Commonly known as: COREG   Take 1 tablet (6.25 mg total) by mouth 2 (two) times daily with a meal.      furosemide 40 MG tablet   Commonly known as: LASIX    Take 1 tablet (40 mg total) by mouth 2 (two) times daily. For one week      lisinopril 20 MG tablet   Commonly known as: PRINIVIL,ZESTRIL   Take 20 mg by mouth daily.      potassium chloride SA 20 MEQ tablet   Commonly known as: K-DUR,KLOR-CON   Take 1 tablet (20 mEq total) by mouth daily. X 1 week      traMADol 50 MG tablet   Commonly known as: ULTRAM   Take 1-2 tablets (50-100 mg total) by mouth every 4 (four) hours as needed for pain.      warfarin 4 MG tablet   Commonly known as: COUMADIN   Take 1 tablet (4 mg total) by mouth daily at  6 PM.            Discharge Instructions:  The patient is to refrain from driving, heavy lifting or strenuous activity.  May shower daily and clean incisions with soap and water.  May resume regular diet.   Discharge Orders    Future Appointments: Provider: Department: Dept Phone: Center:   09/11/2011 12:30 PM [name], MD [facility] [phone] [facility]     Future Orders Please Complete By Expires   Amb Referral to Cardiac Rehabilitation      Comments:   Referring to [facility] Phase 2      Follow-up Information    Follow up with Peyton Bottoms, MD. Schedule an appointment as soon as possible for a visit in 2 weeks.   Contact information:   [address] [place] [address] [phone]       Follow up with [name], MD on 09/11/2011. (Have a chest x-ray at 11:30, then see MD at 12:30)    Contact information:   [address] [place] [address] [phone]       Follow up with [facility]. (Have a PT/INR at Dr. Margarita Mail office within 48 hours of discharge)           Lowella Dandy 09/03/2011, 12:17 PM

## 2011-08-30 NOTE — Discharge Summary (Addendum)
I agree with the above discharge summary and plan for follow-up.  Would continue once a day lasix + potassium indefinitely until follow up with instructions to check his weight daily.  [name] H

## 2011-08-30 NOTE — Discharge Instructions (Signed)
Shower daily and clean incisions with soap and water.  No driving or lifting over 10 lbs until seen in the office.

## 2011-08-31 ENCOUNTER — Encounter (HOSPITAL_COMMUNITY): Payer: Self-pay

## 2011-08-31 LAB — PROTIME-INR: Prothrombin Time: 19.6 seconds — ABNORMAL HIGH (ref 11.6–15.2)

## 2011-08-31 MED ORDER — CARVEDILOL 6.25 MG PO TABS
6.2500 mg | ORAL_TABLET | Freq: Two times a day (BID) | ORAL | Status: DC
  Administered 2011-08-31 – 2011-09-01 (×2): 6.25 mg via ORAL
  Filled 2011-08-31 (×4): qty 1

## 2011-08-31 NOTE — Progress Notes (Signed)
Physical Therapy Treatment Patient Details Name: [name] MRN: [id number] DOB: 08-Jul-1931 Today's Date: 08/31/2011 Time: 4098-1191 PT Time Calculation (min): 22 min  PT Assessment / Plan / Recommendation Comments on Treatment Session  Pt s/p minimally invasive MVR progressing well with mobility and encourged IS and HEP. Pt unable to ambulate without O2. will continue to follow    Follow Up Recommendations       Barriers to Discharge        Equipment Recommendations       Recommendations for Other Services    Frequency     Plan Discharge plan remains appropriate    Precautions / Restrictions Precautions Precaution Comments: limited RUE use, oxygen Restrictions Weight Bearing Restrictions: No   Pertinent Vitals/Pain At rest 96% on 2L, HR 65 Sitting EOB 91% on RA, HR 65 With 5ft ambulation sats down to 85 on RA, HR 74 With 2L O2 94%, HR 72 No pain    Mobility  Bed Mobility Bed Mobility: Supine to Sit Supine to Sit: 6: Modified independent (Device/Increase time);HOB flat Transfers Transfers: Sit to Stand;Stand to Sit Sit to Stand: 6: Modified independent (Device/Increase time);From bed Stand to Sit: 6: Modified independent (Device/Increase time);To chair/3-in-1 Ambulation/Gait Ambulation/Gait Assistance: 5: Supervision Ambulation Distance (Feet): 600 Feet Assistive device: Rolling walker Ambulation/Gait Assistance Details: cueing to step into RW Gait Pattern: Within Functional Limits Stairs: Yes Stairs Assistance: 5: Supervision Stairs Assistance Details (indicate cue type and reason): cueing for sequence only Stair Management Technique: Backwards;With walker Number of Stairs: 1     Exercises General Exercises - Lower Extremity Long Arc Quad: AROM;Both;15 reps;Seated Hip Flexion/Marching: AROM;Both;15 reps;Seated   PT Diagnosis:    PT Problem List:   PT Treatment Interventions:     PT Goals Acute Rehab PT Goals PT Goal: Supine/Side to Sit - Progress:  Met PT Goal: Sit to Stand - Progress: Met PT Goal: Stand to Sit - Progress: Met PT Goal: Ambulate - Progress: Progressing toward goal PT Goal: Up/Down Stairs - Progress: Met  Visit Information  Last PT Received On: 08/31/11 Assistance Needed: +1    Subjective Data  Subjective: i'm doing fine   Cognition  Overall Cognitive Status: Appears within functional limits for tasks assessed/performed Arousal/Alertness: Awake/alert Orientation Level: Appears intact for tasks assessed Behavior During Session: [facility] for tasks performed    Balance     End of Session PT - End of Session Activity Tolerance: Patient tolerated treatment well Patient left: in chair;with call bell/phone within reach;with family/visitor present Nurse Communication: Mobility status    [name] 08/31/2011, 10:08 AM [name], PT [phone]

## 2011-08-31 NOTE — Progress Notes (Addendum)
[address]            [place] [address]          [phone]     6 Days Post-Op  Procedure(s) (LRB): MINIMALLY INVASIVE MITRAL VALVE REPAIR (MVR) (Right) MAZE (N/A) Subjective: Feels well, still on O2, no specific complaints  Objective  Telemetry SR, freq PVC's  Temp:  [97.8 F (36.6 C)-98.4 F (36.9 C)] 97.8 F (36.6 C) (05/23 0234) Pulse Rate:  [59-68] 67  (05/23 0645) Resp:  [19-20] 20  (05/23 0645) BP: (105-149)/(54-78) 149/78 mmHg (05/23 0645) SpO2:  [92 %-97 %] 92 % (05/23 0234) Weight:  [188 lb 15 oz (85.7 kg)] 188 lb 15 oz (85.7 kg) (05/23 0234)   Intake/Output Summary (Last 24 hours) at 08/31/11 0802 Last data filed at 08/31/11 0600  Gross per 24 hour  Intake   1203 ml  Output    977 ml  Net    226 ml       General appearance: alert, cooperative and no distress Heart: regular rate and rhythm and , freq extrasystole, soft systolic murmur Lungs: clear to auscultation bilaterally Abdomen: normal findings: benign Extremities: no edema Wound: incis healing well  Lab Results:  Basename 08/29/11 0515  NA 137  K 4.2  CL 96  CO2 36*  GLUCOSE 131*  BUN 27*  CREATININE 1.38*  CALCIUM 8.7  MG --  PHOS --   No results found for this basename: AST:2,ALT:2,ALKPHOS:2,BILITOT:2,PROT:2,ALBUMIN:2 in the last 72 hours No results found for this basename: LIPASE:2,AMYLASE:2 in the last 72 hours  Basename 08/29/11 0515  WBC 12.1*  NEUTROABS --  HGB 8.9*  HCT 26.1*  MCV 89.1  PLT 123*   No results found for this basename: CKTOTAL:4,CKMB:4,TROPONINI:4 in the last 72 hours No components found with this basename: POCBNP:3 No results found for this basename: DDIMER in the last 72 hours No results found for this basename: HGBA1C in the last 72 hours No results found for this basename: CHOL,HDL,LDLCALC,TRIG,CHOLHDL in the last 72 hours No results found for this basename: TSH,T4TOTAL,FREET3,T3FREE,THYROIDAB in the last 72  hours No results found for this basename: VITAMINB12,FOLATE,FERRITIN,TIBC,IRON,RETICCTPCT in the last 72 hours  Medications: Scheduled    . acetaminophen  1,000 mg Oral Q6H  . amiodarone  200 mg Oral BID  . aspirin EC  81 mg Oral Daily  . bisacodyl  10 mg Oral Daily   Or  . bisacodyl  10 mg Rectal Daily  . carvedilol  3.125 mg Oral BID WC  . docusate sodium  200 mg Oral Daily  . feeding supplement  237 mL Oral TID PC & HS  . furosemide  40 mg Oral Daily  . lisinopril  20 mg Oral Daily  . pantoprazole  40 mg Oral Q1200  . potassium chloride  20 mEq Oral Daily  . sodium chloride  3 mL Intravenous Q12H  . warfarin  2.5 mg Oral q1800  . Warfarin - Physician Dosing Inpatient   Does not apply q1800     Radiology/Studies:  Dg Chest 2 View  08/30/2011  *RADIOLOGY REPORT*  Clinical Data: Post CABG  CHEST - 2 VIEW  Comparison: 08/28/2011; 08/27/2011; 08/26/2011; 08/20/2011  Findings: Unchanged cardiac silhouette and mediastinal contours post median sternotomy and CABG.  Interval removal of support apparatus with development of a tiny right apical pneumothorax. Overall improved aeration of the right lung with persistent  mid and lower lung heterogeneous opacities.  Small bilateral effusions are suspected.  No definite evidence of pulmonary edema.  Unchanged bones.  Coils overlying left upper abdominal quadrant.  IMPRESSION: 1.  Interval removal of support apparatus with development of a tiny right apical pneumothorax. 2.  Overall improved aeration of the lungs with persistent right mid and lower lung opacities favored to represent atelectasis or contusion.  Original Report Authenticated By: Waynard Reeds, M.D.    INR:1.63 Will add last result for INR, ABG once components are confirmed Will add last 4 CBG results once components are confirmed  Assessment/Plan: S/P Procedure(s) (LRB): MINIMALLY INVASIVE MITRAL VALVE REPAIR (MVR) (Right) MAZE (N/A)  conts to progress well Wean O2 Routine  rehab Increase coreg dose to 6.25 BID Cont AC RX Poss home in am   LOS: 14 days    GOLD,WAYNE E 5/23/20138:02 AM    I have seen and examined the patient and agree with the assessment and plan as outlined.  Possibly ready for d/c home 1-2 days.  He has excellent family support for 23 hr/day care but may need home health nursing checks and home O2  Citlali Gautney H 08/31/2011 11:59 AM

## 2011-08-31 NOTE — Progress Notes (Signed)
CARDIAC REHAB PHASE I   PRE:  Rate/Rhythm: 70 SR    BP: sitting 140/70    SaO2: 90-91 RA  MODE:  Ambulation: 730 ft   POST:  Rate/Rhythm: 80    BP: sitting 144/80     SaO2: 91-92 2L  Tolerated well with RW and 2L O2. Pt had desaturated yesterday and this am on RA (please see CR and PT notes for documentation). Return to recliner. Tired after walk. Ed completed and pt requests his name be sent to [facility]. [phone]  Harriet Masson CES, ACSM

## 2011-09-01 LAB — CBC
HCT: 28.2 % — ABNORMAL LOW (ref 39.0–52.0)
Hemoglobin: 9.4 g/dL — ABNORMAL LOW (ref 13.0–17.0)
MCV: 91.6 fL (ref 78.0–100.0)
RDW: 15.7 % — ABNORMAL HIGH (ref 11.5–15.5)
WBC: 9.2 10*3/uL (ref 4.0–10.5)

## 2011-09-01 LAB — BASIC METABOLIC PANEL
BUN: 20 mg/dL (ref 6–23)
CO2: 35 mEq/L — ABNORMAL HIGH (ref 19–32)
Chloride: 95 mEq/L — ABNORMAL LOW (ref 96–112)
Creatinine, Ser: 1.22 mg/dL (ref 0.50–1.35)
Potassium: 5.1 mEq/L (ref 3.5–5.1)

## 2011-09-01 LAB — PROTIME-INR: INR: 1.64 — ABNORMAL HIGH (ref 0.00–1.49)

## 2011-09-01 MED ORDER — WARFARIN SODIUM 4 MG PO TABS
4.0000 mg | ORAL_TABLET | Freq: Every day | ORAL | Status: DC
  Administered 2011-09-01 – 2011-09-03 (×3): 4 mg via ORAL
  Filled 2011-09-01 (×4): qty 1

## 2011-09-01 MED ORDER — FUROSEMIDE 10 MG/ML IJ SOLN
40.0000 mg | Freq: Four times a day (QID) | INTRAMUSCULAR | Status: AC
  Administered 2011-09-01 (×3): 40 mg via INTRAVENOUS
  Filled 2011-09-01 (×3): qty 4

## 2011-09-01 MED ORDER — CARVEDILOL 3.125 MG PO TABS
3.1250 mg | ORAL_TABLET | Freq: Two times a day (BID) | ORAL | Status: DC
  Filled 2011-09-01 (×2): qty 1

## 2011-09-01 MED ORDER — CARVEDILOL 6.25 MG PO TABS
6.2500 mg | ORAL_TABLET | Freq: Two times a day (BID) | ORAL | Status: DC
  Administered 2011-09-01 – 2011-09-04 (×6): 6.25 mg via ORAL
  Filled 2011-09-01 (×8): qty 1

## 2011-09-01 MED ORDER — LEVALBUTEROL HCL 0.63 MG/3ML IN NEBU
0.6300 mg | INHALATION_SOLUTION | Freq: Three times a day (TID) | RESPIRATORY_TRACT | Status: DC
  Administered 2011-09-01 (×3): 0.63 mg via RESPIRATORY_TRACT
  Filled 2011-09-01 (×4): qty 3

## 2011-09-01 MED ORDER — FUROSEMIDE 40 MG PO TABS
40.0000 mg | ORAL_TABLET | Freq: Two times a day (BID) | ORAL | Status: DC
  Administered 2011-09-02 – 2011-09-04 (×5): 40 mg via ORAL
  Filled 2011-09-01 (×7): qty 1

## 2011-09-01 NOTE — Progress Notes (Signed)
Physical Therapy Treatment Patient Details Name: [name] MRN: [id number] DOB: June 19, 1931 Today's Date: 09/01/2011 Time: 4098-1191 PT Time Calculation (min): 29 min  PT Assessment / Plan / Recommendation Comments on Treatment Session  Pt s/p minimally invasive MVR with decreased ambulatio this am due to nausea. Pt with sats down to 86% EOBon RA and remained on 2L throughout session with sats 90-95 and HR in sinus at 66-70. Pt encouraged to ambulate later today and continue  HEP as able. Pt had spilled urinal on arrival and assist for self care and linen change.    Follow Up Recommendations       Barriers to Discharge        Equipment Recommendations       Recommendations for Other Services    Frequency     Plan Discharge plan remains appropriate;Frequency remains appropriate    Precautions / Restrictions     Pertinent Vitals/Pain Nausea     Mobility  Bed Mobility Bed Mobility: Supine to Sit Supine to Sit: 6: Modified independent (Device/Increase time);HOB elevated (HOb 20 degrees) Transfers Transfers: Sit to Stand;Stand to Sit Sit to Stand: 6: Modified independent (Device/Increase time) Stand to Sit: 6: Modified independent (Device/Increase time) Ambulation/Gait Ambulation/Gait Assistance: 5: Supervision Ambulation Distance (Feet): 80 Feet Assistive device: None Ambulation/Gait Assistance Details: cueing for posture, ambulation limited by nausea- no emesis Gait Pattern: Within Functional Limits Stairs: No    Exercises General Exercises - Lower Extremity Long Arc Quad: AROM;Both;20 reps;Seated Hip Flexion/Marching: AROM;Both;20 reps;Seated   PT Diagnosis:    PT Problem List:   PT Treatment Interventions:     PT Goals Acute Rehab PT Goals PT Goal: Ambulate - Progress: Progressing toward goal  Visit Information  Last PT Received On: 09/01/11 Assistance Needed: +1    Subjective Data  Subjective: I just don't feel well   Cognition  Overall Cognitive  Status: Appears within functional limits for tasks assessed/performed Arousal/Alertness: Awake/alert Orientation Level: Appears intact for tasks assessed Behavior During Session: [facility] for tasks performed    Balance  Static Sitting Balance Static Sitting - Balance Support: Feet supported;No upper extremity supported Static Sitting - Level of Assistance: 7: Independent Static Sitting - Comment/# of Minutes: 5  End of Session PT - End of Session Equipment Utilized During Treatment: Gait belt Activity Tolerance: Other (comment) (limited by nausea) Patient left: with call bell/phone within reach;with chair alarm set;with family/visitor present Nurse Communication: Mobility status    [name] 09/01/2011, 9:21 AM [name], PT [phone]

## 2011-09-01 NOTE — Progress Notes (Signed)
TCTS BRIEF PROGRESS NOTE  7 Days Post-Op  S/P Procedure(s) (LRB): MINIMALLY INVASIVE MITRAL VALVE REPAIR (MVR) (Right) MAZE (N/A)   Mr Tosi feels much better this afternoon after receiving IV lasix.  Breathing much improved  Plan: Continue IV lasix today then restart bid oral lasix tomorrow.  Hold d/c until more stable, possibly ready for d/c home by Sunday or Monday.  Recheck BMET, pro-BNP and CXR in am.  Kymari Nuon H 09/01/2011 2:14 PM

## 2011-09-01 NOTE — Progress Notes (Addendum)
[address]            [place] [address]          [phone]     7 Days Post-Op  Procedure(s) (LRB): MINIMALLY INVASIVE MITRAL VALVE REPAIR (MVR) (Right) MAZE (N/A) Subjective: Breathing feels "tight" , some sputum production  Objective  Telemetry 1 degree AVB, SR, PVC's   Temp:  [98.8 F (37.1 C)] 98.8 F (37.1 C) (05/24 0502) Pulse Rate:  [58-72] 67  (05/24 0502) Resp:  [16-17] 17  (05/24 0502) BP: (128-156)/(79-83) 156/83 mmHg (05/24 0502) SpO2:  [94 %-97 %] 95 % (05/24 0502) Weight:  [187 lb 9.8 oz (85.1 kg)] 187 lb 9.8 oz (85.1 kg) (05/24 0502)   Intake/Output Summary (Last 24 hours) at 09/01/11 0736 Last data filed at 09/01/11 0600  Gross per 24 hour  Intake    960 ml  Output   1425 ml  Net   -465 ml       General appearance: alert, cooperative and no distress Heart: regular rate and rhythm, S1, S2 normal and soft systolic murmur Lungs: diffuse exp wheeze Abdomen: benign  Extremities: minor edema Wound: incisions healing well  Lab Results:  Basename 09/01/11 0437  NA 136  K 5.1  CL 95*  CO2 35*  GLUCOSE 110*  BUN 20  CREATININE 1.22  CALCIUM 9.1  MG --  PHOS --   No results found for this basename: AST:2,ALT:2,ALKPHOS:2,BILITOT:2,PROT:2,ALBUMIN:2 in the last 72 hours No results found for this basename: LIPASE:2,AMYLASE:2 in the last 72 hours  Basename 09/01/11 0437  WBC 9.2  NEUTROABS --  HGB 9.4*  HCT 28.2*  MCV 91.6  PLT 202   No results found for this basename: CKTOTAL:4,CKMB:4,TROPONINI:4 in the last 72 hours No components found with this basename: POCBNP:3 No results found for this basename: DDIMER in the last 72 hours No results found for this basename: HGBA1C in the last 72 hours No results found for this basename: CHOL,HDL,LDLCALC,TRIG,CHOLHDL in the last 72 hours No results found for this basename: TSH,T4TOTAL,FREET3,T3FREE,THYROIDAB in the last 72 hours No results found for this basename:  VITAMINB12,FOLATE,FERRITIN,TIBC,IRON,RETICCTPCT in the last 72 hours  Medications: Scheduled    . amiodarone  200 mg Oral BID  . aspirin EC  81 mg Oral Daily  . bisacodyl  10 mg Oral Daily   Or  . bisacodyl  10 mg Rectal Daily  . carvedilol  6.25 mg Oral BID WC  . docusate sodium  200 mg Oral Daily  . feeding supplement  237 mL Oral TID PC & HS  . furosemide  40 mg Oral Daily  . lisinopril  20 mg Oral Daily  . pantoprazole  40 mg Oral Q1200  . potassium chloride  20 mEq Oral Daily  . sodium chloride  3 mL Intravenous Q12H  . warfarin  2.5 mg Oral q1800  . Warfarin - Physician Dosing Inpatient   Does not apply q1800  . DISCONTD: carvedilol  3.125 mg Oral BID WC     Radiology/Studies:  No results found.  INR:1.64 Will add last result for INR, ABG once components are confirmed Will add last 4 CBG results once components are confirmed  Assessment/Plan: S/P Procedure(s) (LRB): MINIMALLY INVASIVE MITRAL VALVE REPAIR (MVR) (Right) MAZE (N/A)  1 wheezing with cont O2 requirement. Will add xopenex. Beta blocker increased yesterday, will reduse to previous dose. Has a lot of  PVC's , EF 35%, may need to consider EP eval 2 cont AC rx, increase to 4 mg 3 labs are stable  LOS: 15 days    GOLD,WAYNE E 5/24/20137:36 AM    I have seen and examined the patient and agree with the assessment and plan as outlined.  However, increased wheezing most c/w fluid overload, CHF.  Some serosanguinous fluid out from old chest tube sites.  Will increase lasix and give it intravenously today.  I do not agree with cutting beta blocker - symptoms are not likely related to carvedilol.    Valerie Cones H 09/01/2011 10:00 AM

## 2011-09-01 NOTE — Progress Notes (Signed)
Entered room to find patient sitting up in bedside chair with son present. C/o gown being wet upon inspection of area noted that the incision from the mvr was draining pinkish red fluid. Dressing applied with pressure. Dr. Cornelius Moras contacted states, it was from where chest tubes were pulled and to keep changing any dressings.

## 2011-09-01 NOTE — Progress Notes (Signed)
CARDIAC REHAB PHASE I   PRE:  Rate/Rhythm: 69 SR  BP:  Supine:   Sitting: 110/60  Standing:    SaO2: 91 RA  MODE:  Ambulation: 730 ft   POST:  Rate/Rhythem:   BP:  Supine:   Sitting: 120/74  Standing:    SaO2: 86 RA 92 1L 1310-1345  Assisted X 1 and used walker to ambulate. Gait steady with walker. Pt took two standing rest during walk. States he feels better now than he did this am. DOE noted, RA sat after walk 86%. O2 reapplied 1L sat improved to 92%. Pt back to recliner after walk with call light in reach.  [name]

## 2011-09-02 ENCOUNTER — Inpatient Hospital Stay (HOSPITAL_COMMUNITY): Payer: Medicare Other

## 2011-09-02 LAB — BASIC METABOLIC PANEL
CO2: 35 mEq/L — ABNORMAL HIGH (ref 19–32)
Calcium: 9.3 mg/dL (ref 8.4–10.5)
Chloride: 93 mEq/L — ABNORMAL LOW (ref 96–112)
Glucose, Bld: 121 mg/dL — ABNORMAL HIGH (ref 70–99)
Sodium: 136 mEq/L (ref 135–145)

## 2011-09-02 LAB — PRO B NATRIURETIC PEPTIDE: Pro B Natriuretic peptide (BNP): 6470 pg/mL — ABNORMAL HIGH (ref 0–450)

## 2011-09-02 MED ORDER — METOLAZONE 5 MG PO TABS
5.0000 mg | ORAL_TABLET | Freq: Every day | ORAL | Status: AC
  Administered 2011-09-03: 5 mg via ORAL
  Filled 2011-09-02: qty 1

## 2011-09-02 MED ORDER — LEVALBUTEROL HCL 0.63 MG/3ML IN NEBU
0.6300 mg | INHALATION_SOLUTION | Freq: Three times a day (TID) | RESPIRATORY_TRACT | Status: DC
  Administered 2011-09-02 – 2011-09-04 (×6): 0.63 mg via RESPIRATORY_TRACT
  Filled 2011-09-02 (×13): qty 3

## 2011-09-02 MED ORDER — LEVALBUTEROL HCL 0.63 MG/3ML IN NEBU
0.6300 mg | INHALATION_SOLUTION | Freq: Four times a day (QID) | RESPIRATORY_TRACT | Status: DC | PRN
  Filled 2011-09-02: qty 3

## 2011-09-02 NOTE — Progress Notes (Addendum)
8 Days Post-Op Procedure(s) (LRB): MINIMALLY INVASIVE MITRAL VALVE REPAIR (MVR) (Right) MAZE (N/A) Subjective:  Mr. Brubacher complains of some shortness of breath this morning.  He states it has improved since yesterday.   Objective: Vital signs in last 24 hours: Temp:  [98.1 F (36.7 C)-99 F (37.2 C)] 99 F (37.2 C) (05/25 0419) Pulse Rate:  [57-80] 67  (05/25 0419) Cardiac Rhythm:  [-] Normal sinus rhythm;Heart block (05/24 1940) Resp:  [18-19] 19  (05/25 0419) BP: (103-120)/(64-76) 120/64 mmHg (05/25 0419) SpO2:  [91 %-97 %] 92 % (05/25 0419) Weight:  [186 lb 11.7 oz (84.7 kg)] 186 lb 11.7 oz (84.7 kg) (05/25 0419)  Intake/Output from previous day: 05/24 0701 - 05/25 0700 In: 440 [P.O.:440] Out: 2775 [Urine:2775]  General appearance: alert, cooperative and no distress Heart: regular rate and rhythm Lungs: clear to auscultation bilaterally Abdomen: soft, non-tender; bowel sounds normal; no masses,  no organomegaly Extremities: edema trace Wound: clean and dry, ecchymosis across entire chest  Lab Results:  [facility] 09/01/11 0437  WBC 9.2  HGB 9.4*  HCT 28.2*  PLT 202   BMET:  Basename 09/02/11 0545 09/01/11 0437  NA 136 136  K 4.2 5.1  CL 93* 95*  CO2 35* 35*  GLUCOSE 121* 110*  BUN 23 20  CREATININE 1.37* 1.22  CALCIUM 9.3 9.1    PT/INR:  Basename 09/02/11 0545  LABPROT 21.4*  INR 1.82*   ABG    Component Value Date/Time   PHART 7.442 08/26/2011 0857   HCO3 25.7* 08/26/2011 0857   TCO2 26 08/26/2011 1626   ACIDBASEDEF 2.0 08/21/2011 1551   O2SAT 92.0 08/26/2011 0857   CBG (last 3)   Basename 08/31/11 1203  GLUCAP 123*    Assessment/Plan: S/P Procedure(s) (LRB): MINIMALLY INVASIVE MITRAL VALVE REPAIR (MVR) (Right) MAZE (N/A)  2. CV- NSR currently on amiodarone, coreg, lisinopril 3. Resp- patient with dyspnea and continues to require oxygen, xopenex ordered yesterday, BNP over 6000, CXR shows atelectasis and right basilar opacity, will continue  Lasix 4. INR 1.82 continue coumadin 4mg , dose increased yesterday 5. Fluid Balance- patient with good UO after IV Lasix yesterday, weight still up from admission 6. Dispo- patient was doing well.  Developed new complaint of dyspnea yesterday, given IV Lasix with good results.  Encouraged aggressive IS use, and changed Xopenex to 3x/day.  If unable to wean oxygen will need to arrange for home use.  If patients symptoms improve will aim for d/c in next 24-48 hours    LOS: 16 days    BARRETT, ERIN 09/02/2011   patient examined and medical record reviewed,agree with above note Will wean off O2 prior to DC and cont diuresis VAN TRIGT III,Kalley Nicholl 09/02/2011

## 2011-09-02 NOTE — Progress Notes (Signed)
CARDIAC REHAB PHASE I   PRE:  Rate/Rhythm: 66 SR  BP:  Supine:   Sitting: 90/60  Standing:    SaO2: 94 1L  MODE:  Ambulation: 650 ft   POST:  Rate/Rhythem: 71  BP:  Supine:   Sitting: 88/60  Standing:    SaO2: 96 2L 1055-1130 Assisted x 1 and used walker to ambulate. Gait steady with walker. Started pt out on room air, he became weak and felt that he had to sit. Got pt chair to sit in RA sat 90%, placed pt on O2 2L.After pt sat he was able to walk further without any c/o. To recliner after walk with call light in reach. Left pt on O2 1L.  Beatrix Fetters

## 2011-09-02 NOTE — Progress Notes (Signed)
1700 ambulated the pt . Walked  Along hallway and around nurse's station  With  02 2l/m in progress . With observable shortness of  but denied dyspnea . Room air  90% before ambulation .placed on 2l/m  sat  02 sat 92% on ambulation . Recovery /resting 96%  On 2l/m. Pt ambulated independently with rolling front wheel  walker

## 2011-09-03 ENCOUNTER — Inpatient Hospital Stay (HOSPITAL_COMMUNITY): Payer: Medicare Other

## 2011-09-03 LAB — PROTIME-INR: INR: 2.19 — ABNORMAL HIGH (ref 0.00–1.49)

## 2011-09-03 MED ORDER — WARFARIN SODIUM 4 MG PO TABS: 4.0000 mg | ORAL_TABLET | Freq: Every day | ORAL | Status: DC

## 2011-09-03 MED ORDER — CARVEDILOL 6.25 MG PO TABS: 6.2500 mg | ORAL_TABLET | Freq: Two times a day (BID) | ORAL | Status: DC

## 2011-09-03 MED ORDER — FUROSEMIDE 40 MG PO TABS: 40.0000 mg | ORAL_TABLET | Freq: Two times a day (BID) | ORAL | Status: DC

## 2011-09-03 NOTE — Progress Notes (Addendum)
9 Days Post-Op Procedure(s) (LRB): MINIMALLY INVASIVE MITRAL VALVE REPAIR (MVR) (Right) MAZE (N/A)  Subjective: Patient states he still feels short of breath and still feels like he is wheezing some.  He states it does not feel any different when he is walking.  The patient states he was hoping to be discharged today.  I explained to him that we would prefer his breathing to be a little better prior to his discharge.  Objective: Vital signs in last 24 hours: Temp:  [97.4 F (36.3 C)-98.9 F (37.2 C)] 98.4 F (36.9 C) (05/26 0429) Pulse Rate:  [64-66] 66  (05/26 0429) Cardiac Rhythm:  [-] Normal sinus rhythm;Heart block (05/25 2000) Resp:  [16-17] 17  (05/26 0429) BP: (111-134)/(68-73) 134/70 mmHg (05/26 0429) SpO2:  [94 %-97 %] 94 % (05/26 0429) Weight:  [181 lb 1.6 oz (82.146 kg)] 181 lb 1.6 oz (82.146 kg) (05/26 0429)  Intake/Output from previous day: 05/25 0701 - 05/26 0700 In: 243 [P.O.:240; I.V.:3] Out: 1350 [Urine:1350]  General appearance: alert, cooperative and no distress Heart: regular rate and rhythm Lungs: clear to auscultation bilaterally Abdomen: soft, non-tender; bowel sounds normal; no masses,  no organomegaly Extremities: edema trace Wound: clean and dry, ecchymotic across chest  Lab Results:  [facility] 09/01/11 0437  WBC 9.2  HGB 9.4*  HCT 28.2*  PLT 202   BMET:  Basename 09/02/11 0545 09/01/11 0437  NA 136 136  K 4.2 5.1  CL 93* 95*  CO2 35* 35*  GLUCOSE 121* 110*  BUN 23 20  CREATININE 1.37* 1.22  CALCIUM 9.3 9.1    PT/INR:  Basename 09/03/11 0440  LABPROT 24.7*  INR 2.19*   ABG    Component Value Date/Time   PHART 7.442 08/26/2011 0857   HCO3 25.7* 08/26/2011 0857   TCO2 26 08/26/2011 1626   ACIDBASEDEF 2.0 08/21/2011 1551   O2SAT 92.0 08/26/2011 0857   CBG (last 3)   Basename 08/31/11 1203  GLUCAP 123*    Assessment/Plan: S/P Procedure(s) (LRB): MINIMALLY INVASIVE MITRAL VALVE REPAIR (MVR) (Right) MAZE (N/A)  2. CV- NSR, rate  in the 60s, mild hypertension- on Coreg, Amiodarone, Lisinopril 3. Resp- patient complains of dyspnea/wheezing, no wheezing appreciated on exam, patient also continues to require oxygen.  Patient encouraged to use IS multiple times per hour, and ambulate throughout the day.  Will continue nebulizer treatments 4. INR 2.19- good response to increase to 4mg , will continue current dose 5. Fluid Balance- patients weight is stable at baseline, good U/O with Lasix, will continue for now 6. Dispo- patient continues to have issues with dyspnea.  I reviewed his PFTs which were poor with an FEV 1 of 1.37.  Will continue to try and wean oxygen, however he may require home oxygen at discharge with referral to pulmonology.     LOS: 17 days    BARRETT, ERIN 09/03/2011   patient examined and medical record reviewed,agree with above note.   Anticipate DC tomorrow if home O2 can be arranged over the holiday VAN TRIGT III,Yue Glasheen 09/03/2011

## 2011-09-03 NOTE — Progress Notes (Signed)
Patient ambulated in the hallway 200 ft with RW with no O2. Patient's O2 saturation level stayed between 78 - 83%. Patient returned to room and stated that he felt short of breath. Applied oxygen at 1 L and O2 saturation returned to 96%. Waited 2 minutes and removed oxygen to see what patient's O2 saturation is on RA at rest and patient's oxygen stayed between 90 - 96% O2 saturation. Lajuana Matte, RN

## 2011-09-03 NOTE — Progress Notes (Signed)
Pt does not want to walk again at this time, states that he is tired from last walk and became SOB.  Encouraged pt to walk again later.  Will continue to monitor.  Arva Chafe

## 2011-09-04 LAB — PROTIME-INR
INR: 2.28 — ABNORMAL HIGH (ref 0.00–1.49)
Prothrombin Time: 25.5 seconds — ABNORMAL HIGH (ref 11.6–15.2)

## 2011-09-04 MED ORDER — WARFARIN SODIUM 4 MG PO TABS
4.0000 mg | ORAL_TABLET | Freq: Every day | ORAL | Status: DC
  Filled 2011-09-04: qty 1

## 2011-09-04 NOTE — Plan of Care (Signed)
Problem: Phase II Progression Outcomes Goal: Tolerates weaning with O2 Sat > 90 Outcome: Completed/Met Date Met:  09/04/11 When walking patient desats in the 80's. At rest patient stays in the 90% on RA  Problem: Discharge Progression Outcomes Goal: Other Discharge Outcomes/Goals Outcome: Completed/Met Date Met:  09/04/11 HH and Oxygen being arranged

## 2011-09-04 NOTE — Progress Notes (Signed)
                    [address]            [place] [address]          [phone]     10 Days Post-Op Procedure(s) (LRB): MINIMALLY INVASIVE MITRAL VALVE REPAIR (MVR) (Right) MAZE (N/A)  Subjective: Feels like breathing is getting better. Wants to go home.  Objective: Vital signs in last 24 hours: Patient Vitals for the past 24 hrs:  BP Temp Temp src Pulse Resp SpO2 Weight  09/04/11 0802 - - - - - 92 % -  09/04/11 0500 - - - - - - 177 lb 6.4 oz (80.468 kg)  09/04/11 0438 123/71 mmHg 98.6 F (37 C) Oral 67  16  95 % -  09/03/11 2009 110/64 mmHg 97.4 F (36.3 C) Oral 63  17  94 % -  09/03/11 1500 114/65 mmHg - Oral 67  16  90 % -  09/03/11 1447 - - - - - 91 % -  09/03/11 1002 120/68 mmHg - - - - - -  09/03/11 0850 - - - - - 95 % -   Current Weight  09/04/11 177 lb 6.4 oz (80.468 kg)     Intake/Output from previous day: 05/26 0701 - 05/27 0700 In: 720 [P.O.:720] Out: 3075 [Urine:3075]    PHYSICAL EXAM:  Heart: RRR Lungs:clear Wound: clean and dry Extremities: no significant edema  Lab Results: CBC:No results found for this basename: WBC:2,HGB:2,HCT:2,PLT:2 in the last 72 hours BMET:  [facility] 09/02/11 0545  NA 136  K 4.2  CL 93*  CO2 35*  GLUCOSE 121*  BUN 23  CREATININE 1.37*  CALCIUM 9.3    PT/INR:  Basename 09/04/11 0430  LABPROT 25.5*  INR 2.28*     Assessment/Plan: S/P Procedure(s) (LRB): MINIMALLY INVASIVE MITRAL VALVE REPAIR (MVR) (Right) MAZE (N/A) CV- SR, BPs improved. Vol overload- continue diuresis. INR therapeutic. Pulm- desats with exertion.  Home O2 arranged. D/c home today- instructions reviewed with pt.   LOS: 18 days    [name] 09/04/2011

## 2011-09-04 NOTE — Progress Notes (Signed)
SATURATION QUALIFICATIONS:  Patient Saturations on Room Air at Rest = 90-96%  Patient Saturations on Room Air while Ambulating = 78 -83%  Patient Saturations on 1 Liters of oxygen while Ambulating = 96%

## 2011-09-05 NOTE — Discharge Summary (Signed)
I agree with the above discharge summary and plan for follow-up.  Ustin Cruickshank H  

## 2011-09-07 ENCOUNTER — Ambulatory Visit: Payer: Self-pay | Admitting: *Deleted

## 2011-09-07 DIAGNOSIS — Z9889 Other specified postprocedural states: Secondary | ICD-10-CM

## 2011-09-07 DIAGNOSIS — I4819 Other persistent atrial fibrillation: Secondary | ICD-10-CM

## 2011-09-07 DIAGNOSIS — Z953 Presence of xenogenic heart valve: Secondary | ICD-10-CM

## 2011-09-08 ENCOUNTER — Other Ambulatory Visit: Payer: Self-pay | Admitting: Thoracic Surgery (Cardiothoracic Vascular Surgery)

## 2011-09-08 DIAGNOSIS — I059 Rheumatic mitral valve disease, unspecified: Secondary | ICD-10-CM

## 2011-09-11 ENCOUNTER — Ambulatory Visit
Admission: RE | Admit: 2011-09-11 | Discharge: 2011-09-11 | Disposition: A | Discharge status: Home / Self Care | Payer: Medicare Other | Source: Ambulatory Visit | Attending: Thoracic Surgery (Cardiothoracic Vascular Surgery) | Admitting: Thoracic Surgery (Cardiothoracic Vascular Surgery)

## 2011-09-11 ENCOUNTER — Ambulatory Visit (INDEPENDENT_AMBULATORY_CARE_PROVIDER_SITE_OTHER): Payer: Self-pay | Admitting: Thoracic Surgery (Cardiothoracic Vascular Surgery)

## 2011-09-11 ENCOUNTER — Encounter: Payer: Self-pay | Admitting: Thoracic Surgery (Cardiothoracic Vascular Surgery)

## 2011-09-11 VITALS — BP 90/52 | HR 56 | Resp 18 | Ht 71.0 in | Wt 176.0 lb

## 2011-09-11 DIAGNOSIS — Z9889 Other specified postprocedural states: Secondary | ICD-10-CM

## 2011-09-11 DIAGNOSIS — I059 Rheumatic mitral valve disease, unspecified: Secondary | ICD-10-CM

## 2011-09-11 DIAGNOSIS — I340 Nonrheumatic mitral (valve) insufficiency: Secondary | ICD-10-CM

## 2011-09-11 DIAGNOSIS — Z8679 Personal history of other diseases of the circulatory system: Secondary | ICD-10-CM

## 2011-09-11 MED ORDER — LISINOPRIL 20 MG PO TABS: 10.0000 mg | ORAL_TABLET | Freq: Every day | ORAL | Status: DC

## 2011-09-11 MED ORDER — AMIODARONE HCL 200 MG PO TABS: 200.0000 mg | ORAL_TABLET | Freq: Every day | ORAL | Status: DC

## 2011-09-11 NOTE — Progress Notes (Signed)
[address]            [name] [address]          [phone]     CARDIOTHORACIC SURGERY OFFICE NOTE  Referring Provider is Herby Abraham, MD PCP is Peyton Bottoms, MD, MD   HPI:  Patient returns for followup status post minimally invasive mitral valve repair and Maze procedure on 08/25/2011. The patient had previously undergone aortic valve replacement and coronary artery bypass grafting in the distant past. He presented with class IV congestive heart failure and persistent atrial fibrillation with flail segment of his mitral valve and severe mitral regurgitation. Postoperatively the patient has done fairly well. Since hospital discharge the patient has made some progress, but he reports that he has been feeling tired and weak. He's had minimal pain and he has not been taking any pain medication. He's not had any shortness of breath and he thinks that his breathing is better than it was prior to surgery. However, he is just felt weak and tired and he hasn't been getting around too much. His prothrombin time was checked last week and his INR was notably greater than 4. His Coumadin dose was adjusted. He returns to our office today for routine followup. He has not yet been seen in followup by Dr. Andee Lineman. He states that he TEE and that he admits that his appetite is not very good.   Current Outpatient Prescriptions  Medication Sig Dispense Refill  . amiodarone (PACERONE) 200 MG tablet Take 1 tablet (200 mg total) by mouth daily.  60 tablet  1  . aspirin EC 81 MG tablet Take 81 mg by mouth daily.      . carvedilol (COREG) 6.25 MG tablet Take 1 tablet (6.25 mg total) by mouth 2 (two) times daily with a meal.  60 tablet  1  . lisinopril (PRINIVIL,ZESTRIL) 20 MG tablet Take 0.5 tablets (10 mg total) by mouth daily.  30 tablet  0  . warfarin (COUMADIN) 4 MG tablet Take 1 tablet (4 mg total) by mouth daily at 6 PM.  30 tablet  1  . DISCONTD: amiodarone (PACERONE)  200 MG tablet Take 1 tablet (200 mg total) by mouth 2 (two) times daily.  60 tablet  1  . DISCONTD: lisinopril (PRINIVIL,ZESTRIL) 20 MG tablet Take 20 mg by mouth daily.           Physical Exam:   BP 90/52  Pulse 56  Resp 18  Ht 5\' 11"  (1.803 m)  Wt 176 lb (79.833 kg)  BMI 24.55 kg/m2  SpO2 92%  General:  Well-appearing  Chest:   Clear to auscultation with slightly diminished breath sounds at lung bases, right more so than left  CV:   Regular rate and rhythm with no murmur  Incisions:  Clean and dry and healing nicely  Abdomen:  Soft and nontender  Extremities:  Warm and well-perfused. Skin turgor appears dry.  Diagnostic Tests:  Chest x-ray remains essentially stable with small bilateral effusions including likely loculated pleural fluid collection on the right which has changed minimally since hospital discharge  2 channel telemetry rhythm strip demonstrates what appears to be either junctional rhythm or sinus rhythm with occasional premature ventricular contraction.   Impression:  Overall the patient remains stable 2 weeks following minimally invasive mitral valve repair and Maze procedure.  The patient's blood pressure seems to  be running on the low side. He also appears relatively dehydrated and his appetite is marginal.  Plan:  I've instructed the patient to stop taking Lasix and potassium. We will cut his dose of lisinopril to 10 mg daily.  We will cut his dose of amiodarone to 200 mg daily. I've encouraged patient to make sure that he has an appointment to see Dr. Andee Lineman within the next 2 weeks. We will have the home health nurses called his blood pressure does not improve with the maneuvers we have done today we will plan to see the patient back in 4 weeks for further followup and rhythm check.    Salvatore Decent. Cornelius Moras, MD 09/11/2011 1:10 PM

## 2011-09-11 NOTE — Patient Instructions (Signed)
Stop lasix and potassium, decrease lisinopril to 10 mg daily, decrease amiodarone to 200 mg daily

## 2011-09-12 ENCOUNTER — Ambulatory Visit: Payer: Self-pay | Admitting: *Deleted

## 2011-09-12 DIAGNOSIS — Z9889 Other specified postprocedural states: Secondary | ICD-10-CM

## 2011-09-12 DIAGNOSIS — I4819 Other persistent atrial fibrillation: Secondary | ICD-10-CM

## 2011-09-12 DIAGNOSIS — Z953 Presence of xenogenic heart valve: Secondary | ICD-10-CM

## 2011-09-12 LAB — PROTIME-INR: INR: 5.7 — AB (ref 0.9–1.1)

## 2011-09-14 ENCOUNTER — Ambulatory Visit (INDEPENDENT_AMBULATORY_CARE_PROVIDER_SITE_OTHER): Payer: Medicare Other | Admitting: *Deleted

## 2011-09-14 DIAGNOSIS — Z953 Presence of xenogenic heart valve: Secondary | ICD-10-CM

## 2011-09-14 DIAGNOSIS — I4891 Unspecified atrial fibrillation: Secondary | ICD-10-CM

## 2011-09-14 DIAGNOSIS — Z954 Presence of other heart-valve replacement: Secondary | ICD-10-CM

## 2011-09-14 DIAGNOSIS — Z9889 Other specified postprocedural states: Secondary | ICD-10-CM

## 2011-09-14 DIAGNOSIS — I4819 Other persistent atrial fibrillation: Secondary | ICD-10-CM

## 2011-09-14 LAB — PROTIME-INR: INR: 3.4 — AB (ref 0.9–1.1)

## 2011-09-19 ENCOUNTER — Ambulatory Visit (INDEPENDENT_AMBULATORY_CARE_PROVIDER_SITE_OTHER): Payer: Medicare Other | Admitting: Cardiology

## 2011-09-19 ENCOUNTER — Encounter: Payer: Self-pay | Admitting: Cardiology

## 2011-09-19 VITALS — BP 123/71 | HR 64 | Ht 71.0 in | Wt 177.8 lb

## 2011-09-19 DIAGNOSIS — Z9889 Other specified postprocedural states: Secondary | ICD-10-CM

## 2011-09-19 DIAGNOSIS — I519 Heart disease, unspecified: Secondary | ICD-10-CM

## 2011-09-19 DIAGNOSIS — I2589 Other forms of chronic ischemic heart disease: Secondary | ICD-10-CM

## 2011-09-19 DIAGNOSIS — R0602 Shortness of breath: Secondary | ICD-10-CM

## 2011-09-19 DIAGNOSIS — I059 Rheumatic mitral valve disease, unspecified: Secondary | ICD-10-CM

## 2011-09-19 DIAGNOSIS — I340 Nonrheumatic mitral (valve) insufficiency: Secondary | ICD-10-CM

## 2011-09-19 DIAGNOSIS — Z8679 Personal history of other diseases of the circulatory system: Secondary | ICD-10-CM

## 2011-09-19 DIAGNOSIS — I509 Heart failure, unspecified: Secondary | ICD-10-CM

## 2011-09-19 DIAGNOSIS — Z951 Presence of aortocoronary bypass graft: Secondary | ICD-10-CM

## 2011-09-19 DIAGNOSIS — I5023 Acute on chronic systolic (congestive) heart failure: Secondary | ICD-10-CM

## 2011-09-19 DIAGNOSIS — I255 Ischemic cardiomyopathy: Secondary | ICD-10-CM

## 2011-09-19 MED ORDER — CARVEDILOL 6.25 MG PO TABS: 3.1250 mg | ORAL_TABLET | Freq: Two times a day (BID) | ORAL | Status: DC

## 2011-09-19 NOTE — Assessment & Plan Note (Signed)
Patient may require tuneup with intravenous milrinone depending on his echocardiogram and BNP level his progression of symptoms. He has significant crackles on examination and may require Lasix in addition to intravenous milrinone.

## 2011-09-19 NOTE — Assessment & Plan Note (Signed)
Patient has significant crackles on examination left greater than right possibly suggestive of heart failure. We will draw CBC BMET and a BNP level. The patient will come back for an R.N. visit on Friday his BNP level is markedly elevated he will receive intravenous Lasix. In the interim also decrease his Coreg to 3.125twice a day as the patient appears to have decreased myocardial reserve. We will also order a formal echocardiogram. I'm interested in evaluating his mitral valve gradient postoperatively.

## 2011-09-19 NOTE — Assessment & Plan Note (Signed)
No recurrent atrial fibrillation. EKG demonstrates normal sinus rhythm patient is currently on amiodarone the dose has been decreased to 200 mg daily.

## 2011-09-19 NOTE — Progress Notes (Signed)
Harry Bottoms, MD, [facility] ABIM Board Certified in Adult Cardiovascular Medicine,Internal Medicine and Critical Care Medicine    CC: Followup after recent mitral valve repair and Cox-Maze procedure with biatrial lesion set  HPI:  The patient presented recently in followup to a surgeon. He was noted to be hypotensive. Amiodarone was decreased to 200 mg a day, lisinopril was cut in half and Lasix was discontinued. Blood pressure is much improved today during physical examination and with the blood pressure both arms and there are equal. Clinically however the patient still feels very weak and short of breath on minimal exertion. He remains in NYHA class III. I did a bedside echocardiogram and there is no evidence of mitral regurgitation post mitral valve repair but there is severe LV dysfunction with an ejection fraction of 15-20%. The patient states that he has good and bad days. Overall he thinks is slowly feeling better but still has not much stamina or exercise tolerance. He denies any chest pain palpitations presyncope or syncope. He had a chest x-ray done cardiothoracic surgery and was found to have bilateral pleural effusions albeit small. However today on physical examination the patient has significant crackles  PMH: reviewed and listed in Problem List in Electronic Records (and see below) Past Medical History  Diagnosis Date  . CAD (coronary artery disease)     artery bypass graft  . Decreased left ventricular function   . Shortness of breath   . Heart murmur   . Hypertension   . S/P aortic valve replacement with bioprosthetic valve 08/15/2007    25 mm Edwards Magna pericardial tissue valve for bicuspid aortic valve disease with severe aortic insufficiency  . Chronic kidney disease (CKD)   . Atrial fibrillation, persistent   . Congestive heart failure 08/18/2011    Acute on chronic systolic and diastolic  . Mitral regurgitation 08/18/2011  . Coronary artery disease 08/18/2011    S/p  CABG x 3 on 08/15/2007 - LIMA to LAD, SVG to OM, SVG to LPDA  . Peptic ulcer disease 08/18/2011    Remote history of partial gastrectomy for ulcer disease  . S/P partial gastrectomy 08/18/2011  . S/P mitral valve repair 08/25/2011    Complex valvuloplasty including triangular resection of flail commissural leaflet, Goretex neocord replacement x2 and 28 mm Sorin Memo 3D ring annuloplasty via right mini thoracotomy  . S/P Maze operation for atrial fibrillation 08/25/2011    Complete biatrial lesion set using cryothermy   Past Surgical History  Procedure Date  . Coronary artery bypass graft 08/15/2007    CABG x3  . Aortic valve replacement 08/15/2007    25 mm [facility]  . Cardiac catheterization   . Partial gastrectomy   . Cardioversion 11/2007  . Mitral valve repair 08/25/2011    Procedure: MINIMALLY INVASIVE MITRAL VALVE REPAIR (MVR);  Surgeon: [name], MD;  Location: [facility] OR;  Service: Open Heart Surgery;  Laterality: Right;  . Maze 08/25/2011    Procedure: MAZE;  Surgeon: [name], MD;  Location: [facility] OR;  Service: Open Heart Surgery;  Laterality: N/A;    Allergies/SH/FHX : available in Electronic Records for review  No Known Allergies History   Social History  . Marital Status: Widowed    Spouse Name: N/A    Number of Children: N/A  . Years of Education: N/A   Occupational History  . Not on file.   Social History Main Topics  . Smoking status: Former Smoker    Types: Cigarettes  Quit date: 04/10/1978  . Smokeless tobacco: Never Used  . Alcohol Use: No  . Drug Use: No  . Sexually Active: Not on file   Other Topics Concern  . Not on file   Social History Narrative  . No narrative on file   No family history on file.  Medications: Current Outpatient Prescriptions  Medication Sig Dispense Refill  . amiodarone (PACERONE) 200 MG tablet Take 1 tablet (200 mg total) by mouth daily.  60 tablet  1  . aspirin EC 81 MG tablet Take 81 mg by mouth daily.        . carvedilol (COREG) 6.25 MG tablet Take 0.5 tablets (3.125 mg total) by mouth 2 (two) times daily.      [name] lisinopril (PRINIVIL,ZESTRIL) 20 MG tablet Take 0.5 tablets (10 mg total) by mouth daily.  30 tablet  0  . warfarin (COUMADIN) 4 MG tablet Take 1 tablet (4 mg total) by mouth daily at 6 PM.  30 tablet  1  . DISCONTD: carvedilol (COREG) 6.25 MG tablet Take 1 tablet (6.25 mg total) by mouth 2 (two) times daily with a meal.  60 tablet  1    ROS: No nausea or vomiting. No fever or chills.No melena or hematochezia.No bleeding.No claudication  Physical Exam: BP 123/71  Pulse 64  Ht 5\' 11"  (1.803 m)  Wt 177 lb 12.8 oz (80.65 kg)  BMI 24.80 kg/m2 General: Well-nourished white male was also of weight compared to his preoperative weight Neck: Normal curvature and no carotid bruits. No thyromegaly nonnodular thyroid. JVP is 6 cm Lungs: Bilateral crackles approximately 1/2 of the way up on the left side and one third of the way up on the right side. At the base of the left side there are decreased breath sounds overall Cardiac: Regular rate and rhythm with normal S1-S2 no definite pathological murmurs positive S3 Vascular: No edema. Normal distal pulses Skin: Warm and dry Physcologic:  12lead ECG: normal affect normal sinus rhythm first degree AV block inferolateral infarct pattern nonspecific EKG changes. Limited bedside ECHO:N/A No images are attached to the encounter.   Assessment and Plan  LV dysfunction Patient symptoms of low output. Has had problems with low blood pressure and Lasix was discontinued by surgeon as well as his dose of lisinopril was cut in half. Bedside echocardiogram performed today showed significant LV dysfunction worse than preoperatively.  S/P Maze operation for atrial fibrillation No recurrent atrial fibrillation. EKG demonstrates normal sinus rhythm patient is currently on amiodarone the dose has been decreased to 200 mg daily.  S/P mitral valve repair By  bedside echocardiogram visually mitral valve area appears relatively small.  Acute on chronic systolic CHF (congestive heart failure) Patient has significant crackles on examination left greater than right possibly suggestive of heart failure. We will draw CBC BMET and a BNP level. The patient will come back for an R.N. visit on Friday his BNP level is markedly elevated he will receive intravenous Lasix. In the interim also decrease his Coreg to 3.125twice a day as the patient appears to have decreased myocardial reserve. We will also order a formal echocardiogram. I'm interested in evaluating his mitral valve gradient postoperatively.  Severe mitral regurgitation Resolved status post mitral valve repair and no residual mitral regurgitation by bedside echocardiogram performed during today's office visit.  S/P CABG x 3 No recurrent chest pain.  Ischemic cardiomyopathy Patient may require tuneup with intravenous milrinone depending on his echocardiogram and BNP level his progression of symptoms. He  has significant crackles on examination and may require Lasix in addition to intravenous milrinone.    Patient Active Problem List  Diagnoses  . HYPERLIPIDEMIA  . S/P aortic valve replacement with bioprosthetic valve  . S/P CABG x 3  . Chronic kidney disease (CKD)  . Coronary artery disease  . Peptic ulcer disease  . S/P partial gastrectomy  . Severe mitral regurgitation  . Acute on chronic systolic CHF (congestive heart failure)  . Ischemic cardiomyopathy  . S/P mitral valve repair  . S/P Maze operation for atrial fibrillation  . LV dysfunction

## 2011-09-19 NOTE — Assessment & Plan Note (Signed)
Resolved status post mitral valve repair and no residual mitral regurgitation by bedside echocardiogram performed during today's office visit.

## 2011-09-19 NOTE — Assessment & Plan Note (Signed)
By bedside echocardiogram visually mitral valve area appears relatively small.

## 2011-09-19 NOTE — Patient Instructions (Addendum)
Your physician recommends that you go to the [facility] lab for blood work TODAY for BMET, CBC, & BNP.  Nurse visit Friday, 6/14 for vitals & discuss BNP level from today  Decrease Coreg to 3.125mg  twice a day  Follow up in  4 weeks

## 2011-09-19 NOTE — Assessment & Plan Note (Signed)
Patient symptoms of low output. Has had problems with low blood pressure and Lasix was discontinued by surgeon as well as his dose of lisinopril was cut in half. Bedside echocardiogram performed today showed significant LV dysfunction worse than preoperatively.

## 2011-09-19 NOTE — Assessment & Plan Note (Signed)
No recurrent chest pain. 

## 2011-09-22 ENCOUNTER — Encounter: Payer: Self-pay | Admitting: *Deleted

## 2011-09-22 ENCOUNTER — Ambulatory Visit (INDEPENDENT_AMBULATORY_CARE_PROVIDER_SITE_OTHER): Payer: Medicare Other | Admitting: *Deleted

## 2011-09-22 VITALS — BP 113/70 | HR 66 | Ht 71.0 in | Wt 180.0 lb

## 2011-09-22 DIAGNOSIS — I5023 Acute on chronic systolic (congestive) heart failure: Secondary | ICD-10-CM

## 2011-09-22 NOTE — Progress Notes (Signed)
Patient presents to office for nurse visit for vitals and review lab results of BNP . Patient has taken all medications without side effects noted. No c/o dizziness,chest pain or sob.

## 2011-09-25 ENCOUNTER — Ambulatory Visit (INDEPENDENT_AMBULATORY_CARE_PROVIDER_SITE_OTHER): Payer: Medicare Other | Admitting: *Deleted

## 2011-09-25 ENCOUNTER — Telehealth: Payer: Self-pay | Admitting: Cardiology

## 2011-09-25 DIAGNOSIS — Z7901 Long term (current) use of anticoagulants: Secondary | ICD-10-CM

## 2011-09-25 DIAGNOSIS — Z953 Presence of xenogenic heart valve: Secondary | ICD-10-CM

## 2011-09-25 DIAGNOSIS — Z9889 Other specified postprocedural states: Secondary | ICD-10-CM

## 2011-09-25 DIAGNOSIS — Z954 Presence of other heart-valve replacement: Secondary | ICD-10-CM

## 2011-09-25 NOTE — Telephone Encounter (Signed)
MARILYN WOULD LIKE TO DISCUSS PATIENT PT/INR WITH YOU.  PLEASE CALL.

## 2011-09-28 ENCOUNTER — Telehealth: Payer: Self-pay | Admitting: *Deleted

## 2011-09-28 ENCOUNTER — Ambulatory Visit (INDEPENDENT_AMBULATORY_CARE_PROVIDER_SITE_OTHER): Payer: Medicare Other | Admitting: *Deleted

## 2011-09-28 DIAGNOSIS — Z9889 Other specified postprocedural states: Secondary | ICD-10-CM

## 2011-09-28 DIAGNOSIS — Z953 Presence of xenogenic heart valve: Secondary | ICD-10-CM

## 2011-09-28 DIAGNOSIS — Z954 Presence of other heart-valve replacement: Secondary | ICD-10-CM

## 2011-09-28 DIAGNOSIS — Z7901 Long term (current) use of anticoagulants: Secondary | ICD-10-CM

## 2011-09-28 LAB — PROTIME-INR: INR: 2.7 — AB (ref 0.9–1.1)

## 2011-09-28 NOTE — Telephone Encounter (Signed)
See coumadin note. 

## 2011-10-03 ENCOUNTER — Telehealth: Payer: Self-pay | Admitting: Cardiology

## 2011-10-03 NOTE — Telephone Encounter (Signed)
Patient's son, Dorene Sorrow, called to let us know that home health is not going to be coming to his home anymore to check his PT/INR. He has an appt. On 10/19/11 with [facility].  Patient said that he will be taking Sabri to the [facility] ER for them to check his bronchitis sx's and that they can check his PT/INR there.  There is not a CVRR order in his chart, therefore, I did not sch a new pt coumadin clinic visit.

## 2011-10-04 NOTE — Telephone Encounter (Signed)
PT CG states INR was checked in ED yesterday at [facility].  He was started on Abx for bronchitis.  Appt made for F/U INR on 10/10/11 in [place] office.

## 2011-10-04 NOTE — Telephone Encounter (Signed)
See below

## 2011-10-05 ENCOUNTER — Telehealth: Payer: Self-pay | Admitting: *Deleted

## 2011-10-05 NOTE — Telephone Encounter (Signed)
Message copied by Lesle Chris on Thu Oct 05, 2011 11:00 AM ------      Message from: Learta Codding      Created: Sat Sep 30, 2011 12:49 PM       Labs stable NTD

## 2011-10-05 NOTE — Telephone Encounter (Signed)
Notes Recorded by Lesle Chris, LPN on 04/28/1476 at 11:00 AM Patient notified and verbalized understanding. Has OV July.

## 2011-10-16 ENCOUNTER — Encounter: Payer: Self-pay | Admitting: Thoracic Surgery (Cardiothoracic Vascular Surgery)

## 2011-10-16 ENCOUNTER — Ambulatory Visit (INDEPENDENT_AMBULATORY_CARE_PROVIDER_SITE_OTHER): Payer: Self-pay | Admitting: Thoracic Surgery (Cardiothoracic Vascular Surgery)

## 2011-10-16 VITALS — BP 116/77 | HR 74 | Resp 20 | Ht 71.0 in | Wt 178.0 lb

## 2011-10-16 DIAGNOSIS — I359 Nonrheumatic aortic valve disorder, unspecified: Secondary | ICD-10-CM

## 2011-10-16 DIAGNOSIS — Z9889 Other specified postprocedural states: Secondary | ICD-10-CM

## 2011-10-16 DIAGNOSIS — I2510 Atherosclerotic heart disease of native coronary artery without angina pectoris: Secondary | ICD-10-CM

## 2011-10-16 DIAGNOSIS — Z8679 Personal history of other diseases of the circulatory system: Secondary | ICD-10-CM

## 2011-10-16 DIAGNOSIS — Z951 Presence of aortocoronary bypass graft: Secondary | ICD-10-CM

## 2011-10-16 DIAGNOSIS — I059 Rheumatic mitral valve disease, unspecified: Secondary | ICD-10-CM

## 2011-10-16 NOTE — Progress Notes (Signed)
                   [address]            [place] [address]          [phone]     CARDIOTHORACIC SURGERY OFFICE NOTE  Referring Provider is Herby Abraham, MD PCP is Peyton Bottoms, MD   HPI:  Patient returns for further followup status post minimally invasive mitral valve repair and Maze procedure. He was last seen here in the office one month ago. Since then he has continued to gradually improve. Overall he reports no complaints other than tiredness. He reports no shortness of breath. He has had very little pain at all ever since his surgery. His appetite is fairly good. He admits that he has not been push himself physically. He has been seen in followup by Dr. Andee Lineman who continues to monitor his Coumadin dosing and other medications. He has not started the cardiac rehabilitation program. Overall he has no complaints.   Current Outpatient Prescriptions  Medication Sig Dispense Refill  . amiodarone (PACERONE) 200 MG tablet Take 1 tablet (200 mg total) by mouth daily.  60 tablet  1  . aspirin EC 81 MG tablet Take 81 mg by mouth daily.      . carvedilol (COREG) 6.25 MG tablet Take 0.5 tablets (3.125 mg total) by mouth 2 (two) times daily.      [name] lisinopril (PRINIVIL,ZESTRIL) 20 MG tablet Take 0.5 tablets (10 mg total) by mouth daily.  30 tablet  0  . warfarin (COUMADIN) 4 MG tablet Take 1 tablet (4 mg total) by mouth daily at 6 PM.  30 tablet  1      Physical Exam:   BP 116/77  Pulse 74  Resp 20  Ht 5\' 11"  (1.803 m)  Wt 178 lb (80.74 kg)  BMI 24.83 kg/m2  SpO2 94%  General:  Well-appearing  Chest:   Few crackles in the left lung base  CV:   RRR no murmur  Incisions:  Healing great   Abdomen:  soft  Extremities:  Warm, no edema  Diagnostic Tests:  2-channel telemetry rhythm strip demonstrates normal sinus rhythm.   Impression:  Overall patient is doing remarkably well following minimally invasive mitral valve repair and Maze procedure with previous  history of aortic valve replacement and coronary artery bypass grafting in the distant past. The patient has underlying severe left ventricular dysfunction but clinically is doing quite well. He currently remains in sinus rhythm.  Plan:  I've encouraged patient to continue to gradually increase his physical activity without any particular limitations at this time. I've strongly encouraged him to enroll in the cardiac rehabilitation program. I have not made any changes to his current medications. I think it would be reasonable to discontinued amiodarone when his current prescription runs out. All of his questions been addressed. We'll plan to see him back in 2 months for further followup and rhythm check.   Salvatore Decent. Cornelius Moras, MD 10/16/2011 2:21 PM

## 2011-10-19 ENCOUNTER — Encounter: Payer: Self-pay | Admitting: Cardiovascular Disease

## 2011-10-19 ENCOUNTER — Ambulatory Visit (INDEPENDENT_AMBULATORY_CARE_PROVIDER_SITE_OTHER): Payer: Medicare Other | Admitting: Cardiovascular Disease

## 2011-10-19 ENCOUNTER — Encounter (INDEPENDENT_AMBULATORY_CARE_PROVIDER_SITE_OTHER): Payer: Medicare Other

## 2011-10-19 VITALS — BP 116/79 | HR 76 | Ht 71.0 in | Wt 177.0 lb

## 2011-10-19 DIAGNOSIS — Z9889 Other specified postprocedural states: Secondary | ICD-10-CM

## 2011-10-19 DIAGNOSIS — I340 Nonrheumatic mitral (valve) insufficiency: Secondary | ICD-10-CM

## 2011-10-19 DIAGNOSIS — I2510 Atherosclerotic heart disease of native coronary artery without angina pectoris: Secondary | ICD-10-CM

## 2011-10-19 DIAGNOSIS — I059 Rheumatic mitral valve disease, unspecified: Secondary | ICD-10-CM

## 2011-10-19 DIAGNOSIS — I359 Nonrheumatic aortic valve disorder, unspecified: Secondary | ICD-10-CM

## 2011-10-19 DIAGNOSIS — I509 Heart failure, unspecified: Secondary | ICD-10-CM

## 2011-10-19 DIAGNOSIS — I5022 Chronic systolic (congestive) heart failure: Secondary | ICD-10-CM

## 2011-10-19 MED ORDER — SPIRONOLACTONE 25 MG PO TABS: 25.0000 mg | ORAL_TABLET | Freq: Every day | ORAL | Status: DC

## 2011-10-19 NOTE — Assessment & Plan Note (Signed)
No evidence of angina at this time. Continue medical therapy.

## 2011-10-19 NOTE — Assessment & Plan Note (Signed)
No significant mitral regurgitation noted on a recent bedside echocardiogram.

## 2011-10-19 NOTE — Assessment & Plan Note (Signed)
He is maintaining in sinus rhythm. He underwent biatrial Maze procedure. Currently he is on amiodarone 200 mg once daily. I recommend continuing amiodarone for 6 months post surgery. Given the degree of his LV systolic dysfunction, I think it would be detrimental if he goes into atrial fibrillation in the near future. Continue anticoagulation with warfarin which is currently being managed by our clinic.

## 2011-10-19 NOTE — Progress Notes (Signed)
HPI  This is a 78 year old male who is here today for a followup visit. He has a history of aortic valve replacement with a bioprosthetic valve in 2009. He developed progressive mitral regurgitation as well as atrial fibrillation. In May of this year, he underwent minimally invasive mitral valve repair as well as Maze procedure. He had some problems with hypotension that required stopping diuretics and decreasing his heart failure medications. He continues to maintain in sinus rhythm with amiodarone. Overall, he feels better than last visit. His dyspnea has improved. He denies chest pain, orthopnea or lower extremity edema. Dr. Andee Lineman did a bedside echocardiogram during last visit which showed no evidence of mitral regurgitation post mitral valve repair but there was severe LV dysfunction with an ejection fraction of 15-20%.  He feels better than last visit and overall is getting stronger. He had labs done recently which showed a creatinine of 1.5 with a BNP of 280.     No Known Allergies   Current Outpatient Prescriptions on File Prior to Visit  Medication Sig Dispense Refill  . amiodarone (PACERONE) 200 MG tablet Take 1 tablet (200 mg total) by mouth daily.  60 tablet  1  . aspirin EC 81 MG tablet Take 81 mg by mouth daily.      . carvedilol (COREG) 6.25 MG tablet Take 0.5 tablets (3.125 mg total) by mouth 2 (two) times daily.      [name] lisinopril (PRINIVIL,ZESTRIL) 20 MG tablet Take 0.5 tablets (10 mg total) by mouth daily.  30 tablet  0  . DISCONTD: warfarin (COUMADIN) 4 MG tablet Take 1 tablet (4 mg total) by mouth daily at 6 PM.  30 tablet  1  . spironolactone (ALDACTONE) 25 MG tablet Take 1 tablet (25 mg total) by mouth daily.  30 tablet  6     Past Medical History  Diagnosis Date  . CAD (coronary artery disease)     artery bypass graft  . Decreased left ventricular function   . Shortness of breath   . Heart murmur   . Hypertension   . S/P aortic valve replacement with  bioprosthetic valve 08/15/2007    25 mm Edwards Magna pericardial tissue valve for bicuspid aortic valve disease with severe aortic insufficiency  . Chronic kidney disease (CKD)   . Atrial fibrillation, persistent   . Congestive heart failure 08/18/2011    Acute on chronic systolic and diastolic  . Mitral regurgitation 08/18/2011  . Coronary artery disease 08/18/2011    S/p CABG x 3 on 08/15/2007 - LIMA to LAD, SVG to OM, SVG to LPDA  . Peptic ulcer disease 08/18/2011    Remote history of partial gastrectomy for ulcer disease  . S/P partial gastrectomy 08/18/2011  . S/P mitral valve repair 08/25/2011    Complex valvuloplasty including triangular resection of flail commissural leaflet, Goretex neocord replacement x2 and 28 mm Sorin Memo 3D ring annuloplasty via right mini thoracotomy  . S/P Maze operation for atrial fibrillation 08/25/2011    Complete biatrial lesion set using cryothermy     Past Surgical History  Procedure Date  . Coronary artery bypass graft 08/15/2007    CABG x3  . Aortic valve replacement 08/15/2007    25 mm [facility]  . Cardiac catheterization   . Partial gastrectomy   . Cardioversion 11/2007  . Mitral valve repair 08/25/2011    Procedure: MINIMALLY INVASIVE MITRAL VALVE REPAIR (MVR);  Surgeon: [name], MD;  Location: [facility] OR;  Service: Open  Heart Surgery;  Laterality: Right;  . Maze 08/25/2011    Procedure: MAZE;  Surgeon: [name], MD;  Location: [facility] OR;  Service: Open Heart Surgery;  Laterality: N/A;     No family history on file.   History   Social History  . Marital Status: Widowed    Spouse Name: N/A    Number of Children: N/A  . Years of Education: N/A   Occupational History  . Not on file.   Social History Main Topics  . Smoking status: Former Smoker -- 1.0 packs/day for 30 years    Types: Cigarettes    Quit date: 04/10/1968  . Smokeless tobacco: Never Used  . Alcohol Use: No  . Drug Use: No  . Sexually Active: Not on file    Other Topics Concern  . Not on file   Social History Narrative  . No narrative on file     PHYSICAL EXAM   BP 116/79  Pulse 76  Ht 5\' 11"  (1.803 m)  Wt 177 lb (80.287 kg)  BMI 24.69 kg/m2 Constitutional: He is oriented to person, place, and time. He appears well-developed and well-nourished. No distress.  HENT: No nasal discharge.  Head: Normocephalic and atraumatic.  Eyes: Pupils are equal and round. Right eye exhibits no discharge. Left eye exhibits no discharge.  Neck: Normal range of motion. Neck supple. No JVD present. No thyromegaly present.  Cardiovascular: Normal rate, regular rhythm, normal heart sounds and. Exam reveals no gallop and no friction rub. No murmur heard.  Pulmonary/Chest: Effort normal and breath sounds normal. No stridor. No respiratory distress. He has no wheezes. Very few crackles at the base. He exhibits no tenderness.  Abdominal: Soft. Bowel sounds are normal. He exhibits no distension. There is no tenderness. There is no rebound and no guarding.  Musculoskeletal: Normal range of motion. He exhibits no edema and no tenderness.  Neurological: He is alert and oriented to person, place, and time. Coordination normal.  Skin: Skin is warm and dry. No rash noted. He is not diaphoretic. No erythema. No pallor.  Psychiatric: He has a normal mood and affect. His behavior is normal. Judgment and thought content normal.     EKG: NSR with 1 st degree AVB, IVCD.     ASSESSMENT AND PLAN

## 2011-10-19 NOTE — Patient Instructions (Addendum)
   Aldactone 25mg  daily  Labs:  BMET - do in 7-10 days  Labs:  BMET - do in 1 month  Echo - do in 2 months  Office will contact with results  Follow up with Dr. Andee Lineman after above

## 2011-10-19 NOTE — Assessment & Plan Note (Signed)
The patient seems to be improving gradually after recent mitral valve repair. He seems to be only mildly fluid overloaded. His blood pressure has improved after adjusting his medications. His ejection fraction was 15-20%. I recommend starting spironolactone 25 mg once daily. His most recent creatinine was 1.5. Recommend basic metabolic profile in one week and in one month. An echocardiogram will be requested to be done in 2 months followed by an office visit with Dr. Earnestine Leys to reevaluate his ejection fraction and consider an ICD if his EF remains less than 35%.

## 2011-10-20 ENCOUNTER — Ambulatory Visit (INDEPENDENT_AMBULATORY_CARE_PROVIDER_SITE_OTHER): Payer: Medicare Other | Admitting: *Deleted

## 2011-10-20 DIAGNOSIS — Z9889 Other specified postprocedural states: Secondary | ICD-10-CM

## 2011-10-20 DIAGNOSIS — Z7901 Long term (current) use of anticoagulants: Secondary | ICD-10-CM

## 2011-10-20 DIAGNOSIS — Z953 Presence of xenogenic heart valve: Secondary | ICD-10-CM

## 2011-10-20 DIAGNOSIS — R0989 Other specified symptoms and signs involving the circulatory and respiratory systems: Secondary | ICD-10-CM

## 2011-10-20 DIAGNOSIS — Z954 Presence of other heart-valve replacement: Secondary | ICD-10-CM

## 2011-10-20 LAB — POCT INR: INR: 2.3

## 2011-10-31 ENCOUNTER — Telehealth: Payer: Self-pay | Admitting: *Deleted

## 2011-10-31 NOTE — Telephone Encounter (Signed)
Message copied by Lesle Chris on Tue Oct 31, 2011 10:53 AM ------      Message from: Eustace Moore      Created: Tue Oct 31, 2011  8:10 AM                   ----- Message -----         From: Iran Ouch, MD         Sent: 10/30/2011   3:41 PM           To: Eustace Moore, LPN            Inform patient that labs were fine. Renal function is stable.

## 2011-10-31 NOTE — Telephone Encounter (Signed)
Notes Recorded by Lesle Chris, LPN on 7/82/9562 at 10:53 AM Patient notified and verbalized understanding.

## 2011-11-03 ENCOUNTER — Ambulatory Visit (INDEPENDENT_AMBULATORY_CARE_PROVIDER_SITE_OTHER): Payer: Medicare Other | Admitting: *Deleted

## 2011-11-03 DIAGNOSIS — Z7901 Long term (current) use of anticoagulants: Secondary | ICD-10-CM

## 2011-11-03 DIAGNOSIS — Z953 Presence of xenogenic heart valve: Secondary | ICD-10-CM

## 2011-11-03 DIAGNOSIS — Z954 Presence of other heart-valve replacement: Secondary | ICD-10-CM

## 2011-11-03 DIAGNOSIS — Z9889 Other specified postprocedural states: Secondary | ICD-10-CM

## 2011-11-21 ENCOUNTER — Ambulatory Visit (INDEPENDENT_AMBULATORY_CARE_PROVIDER_SITE_OTHER): Payer: Medicare Other | Admitting: *Deleted

## 2011-11-21 DIAGNOSIS — Z954 Presence of other heart-valve replacement: Secondary | ICD-10-CM

## 2011-11-21 DIAGNOSIS — Z7901 Long term (current) use of anticoagulants: Secondary | ICD-10-CM

## 2011-11-21 DIAGNOSIS — Z9889 Other specified postprocedural states: Secondary | ICD-10-CM

## 2011-11-21 DIAGNOSIS — Z953 Presence of xenogenic heart valve: Secondary | ICD-10-CM

## 2011-11-23 ENCOUNTER — Telehealth: Payer: Self-pay | Admitting: *Deleted

## 2011-11-23 MED ORDER — SPIRONOLACTONE 25 MG PO TABS: 12.5000 mg | ORAL_TABLET | Freq: Every day | ORAL | Status: DC

## 2011-11-23 NOTE — Telephone Encounter (Signed)
Patient and son informed and new prescription sent to [facility].

## 2011-11-23 NOTE — Telephone Encounter (Signed)
Message copied by Eustace Moore on Thu Nov 23, 2011 11:44 AM ------      Message from: Lorine Bears A      Created: Wed Nov 22, 2011  3:45 PM       Inform patient that labs showed stable kidney function but Potassium was slightly high. Decrease Spironolactone to 12.5 mg once daily.

## 2011-12-12 ENCOUNTER — Ambulatory Visit (INDEPENDENT_AMBULATORY_CARE_PROVIDER_SITE_OTHER): Payer: Medicare Other | Admitting: *Deleted

## 2011-12-12 DIAGNOSIS — Z954 Presence of other heart-valve replacement: Secondary | ICD-10-CM

## 2011-12-12 DIAGNOSIS — Z9889 Other specified postprocedural states: Secondary | ICD-10-CM

## 2011-12-12 DIAGNOSIS — Z953 Presence of xenogenic heart valve: Secondary | ICD-10-CM

## 2011-12-12 DIAGNOSIS — Z7901 Long term (current) use of anticoagulants: Secondary | ICD-10-CM

## 2011-12-15 ENCOUNTER — Other Ambulatory Visit: Payer: Self-pay | Admitting: Thoracic Surgery (Cardiothoracic Vascular Surgery)

## 2011-12-15 DIAGNOSIS — Z9889 Other specified postprocedural states: Secondary | ICD-10-CM

## 2011-12-18 ENCOUNTER — Encounter: Payer: Self-pay | Admitting: Thoracic Surgery (Cardiothoracic Vascular Surgery)

## 2011-12-18 ENCOUNTER — Ambulatory Visit (INDEPENDENT_AMBULATORY_CARE_PROVIDER_SITE_OTHER): Payer: Medicare Other | Admitting: Thoracic Surgery (Cardiothoracic Vascular Surgery)

## 2011-12-18 VITALS — BP 124/81 | HR 76 | Resp 18 | Ht 71.0 in | Wt 182.0 lb

## 2011-12-18 DIAGNOSIS — Z954 Presence of other heart-valve replacement: Secondary | ICD-10-CM

## 2011-12-18 DIAGNOSIS — Z9889 Other specified postprocedural states: Secondary | ICD-10-CM

## 2011-12-18 DIAGNOSIS — Z952 Presence of prosthetic heart valve: Secondary | ICD-10-CM

## 2011-12-18 NOTE — Progress Notes (Signed)
                   [address]            [name] [address]          [phone]     CARDIOTHORACIC SURGERY OFFICE NOTE  Referring Provider is Herby Abraham, MD PCP is Peyton Bottoms, MD   HPI:  Patient returns for followup now more than 3 months status post minimally invasive mitral valve repair and Maze procedure on 08/25/2011. The patient has a long history of congestive heart failure and severe LV dysfunction and had previously undergone aortic valve replacement and coronary artery bypass grafting x3 in 2009.  Postoperatively he has done fairly well under the circumstances although he reportedly was noted to have a further decrease in LV ejection fraction on a bedside transthoracic echocardiogram performed 1 month postoperatively.  He was last seen here in our office on 10/16/2011 and he was seen in the [facility] office in [place] on 10/19/2011.  He was started on Spironolactone at that time.  Since then he has remained quite stable from a clinical standpoint. He reports that he does get short of breath if he pushes himself physically, but he notes that his primary limitation seems to be that his legs get weak. He denies any resting shortness of breath, PND, orthopnea, or lower extremity edema. He has not had any chest pain. Overall he feels as though he is getting along fairly well.   Current Outpatient Prescriptions  Medication Sig Dispense Refill  . aspirin EC 81 MG tablet Take 81 mg by mouth daily.      . carvedilol (COREG) 6.25 MG tablet Take 0.5 tablets (3.125 mg total) by mouth 2 (two) times daily.      [name] lisinopril (PRINIVIL,ZESTRIL) 20 MG tablet Take 0.5 tablets (10 mg total) by mouth daily.  30 tablet  0  . spironolactone (ALDACTONE) 25 MG tablet Take 0.5 tablets (12.5 mg total) by mouth daily.  15 tablet  3  . warfarin (COUMADIN) 4 MG tablet Take 2 mg by mouth daily at 6 PM.          Physical Exam:   BP 124/81  Pulse 76  Resp 18  Ht 5\' 11"  (1.803 m)  Wt 182  lb (82.555 kg)  BMI 25.38 kg/m2  SpO2 97%  General:  Well-appearing  Chest:   Clear to auscultation with symmetrical breath sounds  CV:   Regular rate and rhythm with grade 3/6 systolic murmur heard best at the apex  Incisions:  Completely healed  Abdomen:  Soft and nontender  Extremities:  Warm and well-perfused with no lower extremity edema  Diagnostic Tests:  2 channel telemetry rhythm strip demonstrates what appears to be sinus rhythm with occasional premature ventricular contractions   Impression:  Overall the patient seems to be doing fairly well more than 3 months status post mainly invasive mitral valve repair and Maze procedure. He is maintaining sinus rhythm.  Plan:  We have not recommended any changes in the patient's medical therapy at this time. I do think it would be important to check a formal echocardiogram in the near future to reevaluate status of the patient's mitral valve repair and his underlying left ventricular dysfunction. We'll plan to see the patient back in 3 months for further followup.     Salvatore Decent. Cornelius Moras, MD 12/18/2011 10:36 AM

## 2011-12-21 ENCOUNTER — Other Ambulatory Visit: Payer: Self-pay

## 2011-12-21 ENCOUNTER — Other Ambulatory Visit (INDEPENDENT_AMBULATORY_CARE_PROVIDER_SITE_OTHER): Payer: Medicare Other

## 2011-12-21 ENCOUNTER — Other Ambulatory Visit: Payer: Self-pay | Admitting: Physician Assistant

## 2011-12-21 DIAGNOSIS — I509 Heart failure, unspecified: Secondary | ICD-10-CM

## 2011-12-22 ENCOUNTER — Ambulatory Visit (INDEPENDENT_AMBULATORY_CARE_PROVIDER_SITE_OTHER): Payer: Medicare Other | Admitting: Cardiology

## 2011-12-22 ENCOUNTER — Ambulatory Visit (INDEPENDENT_AMBULATORY_CARE_PROVIDER_SITE_OTHER): Payer: Medicare Other | Admitting: *Deleted

## 2011-12-22 VITALS — BP 128/80 | HR 85 | Ht 71.0 in | Wt 180.0 lb

## 2011-12-22 DIAGNOSIS — I5022 Chronic systolic (congestive) heart failure: Secondary | ICD-10-CM

## 2011-12-22 DIAGNOSIS — Z9889 Other specified postprocedural states: Secondary | ICD-10-CM

## 2011-12-22 DIAGNOSIS — Z953 Presence of xenogenic heart valve: Secondary | ICD-10-CM

## 2011-12-22 DIAGNOSIS — I2510 Atherosclerotic heart disease of native coronary artery without angina pectoris: Secondary | ICD-10-CM

## 2011-12-22 DIAGNOSIS — R0602 Shortness of breath: Secondary | ICD-10-CM

## 2011-12-22 DIAGNOSIS — Z954 Presence of other heart-valve replacement: Secondary | ICD-10-CM

## 2011-12-22 DIAGNOSIS — F0390 Unspecified dementia without behavioral disturbance: Secondary | ICD-10-CM

## 2011-12-22 DIAGNOSIS — Z7901 Long term (current) use of anticoagulants: Secondary | ICD-10-CM

## 2011-12-22 DIAGNOSIS — E785 Hyperlipidemia, unspecified: Secondary | ICD-10-CM

## 2011-12-22 MED ORDER — CARVEDILOL 6.25 MG PO TABS: 6.2500 mg | ORAL_TABLET | Freq: Two times a day (BID) | ORAL | Status: DC

## 2011-12-22 NOTE — Patient Instructions (Addendum)
Your physician recommends that you schedule a follow-up appointment in: 2 months. Your physician has recommended you make the following change in your medication: Increased carvedilol 6.25 mg to 1 tablet twice daily. All other medications will remain the same. Your physician recommends that you return for a FASTING lipid profile,BMET/BNP on September 27th, 2013 at [facility]. You have been referred to a Neurologist.

## 2011-12-24 ENCOUNTER — Encounter: Payer: Self-pay | Admitting: Cardiology

## 2011-12-24 ENCOUNTER — Other Ambulatory Visit: Payer: Self-pay | Admitting: Physician Assistant

## 2011-12-24 NOTE — Progress Notes (Signed)
Patient ID: [name], male   DOB: [birth year], 78 y.o.   MRN: [id number] 78 yo with history of CABG/AVR in 2009 and mitral valve repair with Maze in 5/13 returns for cardiology followup.  He has systolic CHF with last echo 25-30%.    Since the most recent heart surgery, he has noted memory difficulty.   His son has noted this also.  He does not remember what he ate this morning and recent history is spotty.    He is in NSR today after Maze in 2013.  He ran out of amiodarone about 2 months ago and has not been taking it.  He can walk on flat ground without trouble but he is short of breath with steps.  No chest pain.  Overall, dyspnea is less since 5/13 surgery.   ECG: NSR, 1st degree AV block, IVCD with QRS 132 msec  PMH: 1. Bioprosthetic AVR: Placed 2009.  Patient had a bicuspid aortic valve with severe AI.  2. Mitral valve repair (right mini-thoractomy) with Maze in 5/13.   3. Systolic CHF:  Echo (9/13) with EF 25-30%, mild LV dilation, posterior and inferior akinesis, bioprosthetic AVR with mean gradient 5 mmHg, MV repair without MR and with mean gradient 5 mmHg, PA systolic pressure 49 mmHg.   4. CAD: s/p CABG (5/09) with LIMA-LAD, SVG-OM, SVG-PDA.  5. CKD 6. PUD: h/o partial gastrectomy.  7. Atrial fibrillation: NSR since Maze.   SH: Quit smoking 1970.  Widower, lives with son in [place].  They own an antique store.   FH: CAD  ROS: All systems reviewed and negative except as per HPI.   Current Outpatient Prescriptions  Medication Sig Dispense Refill  . aspirin EC 81 MG tablet Take 81 mg by mouth daily.      . carvedilol (COREG) 6.25 MG tablet Take 1 tablet (6.25 mg total) by mouth 2 (two) times daily.      [name] lisinopril (PRINIVIL,ZESTRIL) 20 MG tablet Take 0.5 tablets (10 mg total) by mouth daily.  30 tablet  0  . spironolactone (ALDACTONE) 25 MG tablet Take 0.5 tablets (12.5 mg total) by mouth daily.  15 tablet  3  . warfarin (COUMADIN) 4 MG tablet Take 2 mg by mouth daily at  6 PM.        BP 128/80  Pulse 85  Ht 5\' 11"  (1.803 m)  Wt 180 lb (81.647 kg)  BMI 25.10 kg/m2  SpO2 95% General: NAD Neck: No JVD, no thyromegaly or thyroid nodule.  Lungs: Crackles left base.  CV: Nondisplaced PMI.  Heart regular S1/S2, no S3/S4, 2/6 SEM RUSB.  1+ ankle edema.  No carotid bruit.  Normal pedal pulses.  Abdomen: Soft, nontender, no hepatosplenomegaly, no distention.   Neurologic: Alert and oriented x 3.  Psych: Normal affect. Extremities: No clubbing or cyanosis.   Assessment and Plan: 1. Memory difficulty: Patient has been having significant memory difficulty since 5/13 operation. Perhaps cerebral hypoperfusion around the time of the operation had a significant role in this.  I would like him to be formally evaluated.  Aricept or Namenda may be an option.  I will refer him to a neurologist.  2. Systolic CHF: Stable, NYHA class II-III symptoms.  He is not volume overloaded on exam.  I will have him continue lisinopril and spironolactone at current doses.  He will increase Coreg to 6.25 mg bid.  I will get a BMET/BNP.  He would be a candidate for ICD and perhaps CRT (though QRS  is not extremely long) except for the neurological issues.  I would like him to be evaluated by a neurologist before deciding on what to do here.  If dementia is deemed severe, would hold off on device.   3. Hyperlipidemia: He is not on a statin and is not sure why.  I will get lipids and start a statin based on the findings.  4. CAD: Stable without ischemic symptoms.  Continue ASA, statin, ramipril, Coreg.  5. Atrial fibrillation: Maintaining NSR after Maze.  Continue warfarin.  6. Valves: bioprosthetic AVR and repaired MV both looked ok on 9/13 echo.   Larhonda Dettloff [employer]

## 2011-12-25 ENCOUNTER — Telehealth: Payer: Self-pay | Admitting: Cardiology

## 2011-12-25 MED ORDER — WARFARIN SODIUM 4 MG PO TABS: ORAL_TABLET | ORAL | Status: DC

## 2011-12-25 NOTE — Telephone Encounter (Signed)
Will forward to lisa reid in [place]

## 2011-12-25 NOTE — Telephone Encounter (Signed)
Spoke to son.  States pt needs refill on coumadin and coreg.  Per chart coreg was sent 9/13.  Coumadin sent today.

## 2011-12-25 NOTE — Telephone Encounter (Signed)
Refill- plz chk to make sure Coumadin RX dosage is correct. Pt said it has been increased.  Also call [facility] in [place] to verify Coreg and Coumadin dosage per doctor change.

## 2011-12-26 ENCOUNTER — Ambulatory Visit: Payer: Medicare Other | Admitting: Cardiology

## 2011-12-29 ENCOUNTER — Ambulatory Visit (INDEPENDENT_AMBULATORY_CARE_PROVIDER_SITE_OTHER): Payer: Medicare Other | Admitting: *Deleted

## 2011-12-29 DIAGNOSIS — Z9889 Other specified postprocedural states: Secondary | ICD-10-CM

## 2011-12-29 DIAGNOSIS — Z953 Presence of xenogenic heart valve: Secondary | ICD-10-CM

## 2011-12-29 DIAGNOSIS — Z7901 Long term (current) use of anticoagulants: Secondary | ICD-10-CM

## 2011-12-29 DIAGNOSIS — Z954 Presence of other heart-valve replacement: Secondary | ICD-10-CM

## 2011-12-29 LAB — POCT INR: INR: 2.7

## 2012-01-12 ENCOUNTER — Telehealth: Payer: Self-pay | Admitting: *Deleted

## 2012-01-12 DIAGNOSIS — E785 Hyperlipidemia, unspecified: Secondary | ICD-10-CM

## 2012-01-12 DIAGNOSIS — Z79899 Other long term (current) drug therapy: Secondary | ICD-10-CM

## 2012-01-12 DIAGNOSIS — E875 Hyperkalemia: Secondary | ICD-10-CM

## 2012-01-12 MED ORDER — ATORVASTATIN CALCIUM 20 MG PO TABS: 20.0000 mg | ORAL_TABLET | Freq: Every day | ORAL | Status: DC

## 2012-01-12 NOTE — Telephone Encounter (Signed)
Son informed, new prescription sent to [facility] and lab orders faxed to [facility].

## 2012-01-12 NOTE — Telephone Encounter (Signed)
Message copied by Eustace Moore on Fri Jan 12, 2012  4:03 PM ------      Message from: Laurey Morale      Created: Fri Jan 12, 2012 11:41 AM       K is high.  Stop spironolactone and repeat BMET in 1 week.  LDL is high.  Would start statin: atorvastatin 20 mg daily with lipids/LFTs in 2 months.

## 2012-01-19 ENCOUNTER — Ambulatory Visit (INDEPENDENT_AMBULATORY_CARE_PROVIDER_SITE_OTHER): Payer: Medicare Other | Admitting: *Deleted

## 2012-01-19 DIAGNOSIS — Z9889 Other specified postprocedural states: Secondary | ICD-10-CM

## 2012-01-19 DIAGNOSIS — Z7901 Long term (current) use of anticoagulants: Secondary | ICD-10-CM

## 2012-01-19 DIAGNOSIS — Z953 Presence of xenogenic heart valve: Secondary | ICD-10-CM

## 2012-01-19 DIAGNOSIS — Z952 Presence of prosthetic heart valve: Secondary | ICD-10-CM

## 2012-01-19 LAB — POCT INR: INR: 1.6

## 2012-01-23 ENCOUNTER — Telehealth: Payer: Self-pay | Admitting: *Deleted

## 2012-01-23 NOTE — Telephone Encounter (Signed)
Patient informed. 

## 2012-01-23 NOTE — Telephone Encounter (Signed)
Message copied by Eustace Moore on Tue Jan 23, 2012  3:11 PM ------      Message from: Laurey Morale      Created: Tue Jan 23, 2012  8:35 AM       Good lipids, creatinine ok.

## 2012-02-02 ENCOUNTER — Ambulatory Visit (INDEPENDENT_AMBULATORY_CARE_PROVIDER_SITE_OTHER): Payer: Medicare Other | Admitting: *Deleted

## 2012-02-02 DIAGNOSIS — Z7901 Long term (current) use of anticoagulants: Secondary | ICD-10-CM

## 2012-02-02 DIAGNOSIS — Z952 Presence of prosthetic heart valve: Secondary | ICD-10-CM

## 2012-02-02 DIAGNOSIS — Z9889 Other specified postprocedural states: Secondary | ICD-10-CM

## 2012-02-02 DIAGNOSIS — Z953 Presence of xenogenic heart valve: Secondary | ICD-10-CM

## 2012-02-20 ENCOUNTER — Ambulatory Visit: Payer: Medicare Other | Admitting: Cardiology

## 2012-02-23 ENCOUNTER — Ambulatory Visit (INDEPENDENT_AMBULATORY_CARE_PROVIDER_SITE_OTHER): Payer: Medicare Other | Admitting: *Deleted

## 2012-02-23 DIAGNOSIS — Z953 Presence of xenogenic heart valve: Secondary | ICD-10-CM

## 2012-02-23 DIAGNOSIS — Z9889 Other specified postprocedural states: Secondary | ICD-10-CM

## 2012-02-23 DIAGNOSIS — Z7901 Long term (current) use of anticoagulants: Secondary | ICD-10-CM

## 2012-02-23 DIAGNOSIS — Z952 Presence of prosthetic heart valve: Secondary | ICD-10-CM

## 2012-03-15 ENCOUNTER — Ambulatory Visit (INDEPENDENT_AMBULATORY_CARE_PROVIDER_SITE_OTHER): Payer: Medicare Other | Admitting: *Deleted

## 2012-03-15 DIAGNOSIS — Z953 Presence of xenogenic heart valve: Secondary | ICD-10-CM

## 2012-03-15 DIAGNOSIS — Z9889 Other specified postprocedural states: Secondary | ICD-10-CM

## 2012-03-15 DIAGNOSIS — Z7901 Long term (current) use of anticoagulants: Secondary | ICD-10-CM

## 2012-03-15 DIAGNOSIS — Z952 Presence of prosthetic heart valve: Secondary | ICD-10-CM

## 2012-03-15 LAB — POCT INR: INR: 2

## 2012-03-25 ENCOUNTER — Ambulatory Visit: Payer: Medicare Other | Admitting: Thoracic Surgery (Cardiothoracic Vascular Surgery)

## 2012-04-01 ENCOUNTER — Telehealth: Payer: Self-pay | Admitting: *Deleted

## 2012-04-01 NOTE — Telephone Encounter (Signed)
Left message on patient's voice mail regarding missed coumdin appointment.  °

## 2012-06-04 ENCOUNTER — Other Ambulatory Visit: Payer: Self-pay | Admitting: Cardiology

## 2012-09-10 ENCOUNTER — Ambulatory Visit: Payer: Self-pay | Admitting: Nurse Practitioner

## 2012-10-25 ENCOUNTER — Other Ambulatory Visit: Payer: Self-pay | Admitting: *Deleted

## 2012-10-25 MED ORDER — ATORVASTATIN CALCIUM 20 MG PO TABS: ORAL_TABLET | ORAL | Status: DC

## 2012-10-29 ENCOUNTER — Ambulatory Visit (INDEPENDENT_AMBULATORY_CARE_PROVIDER_SITE_OTHER): Payer: Medicare Other | Admitting: *Deleted

## 2012-10-29 DIAGNOSIS — Z7901 Long term (current) use of anticoagulants: Secondary | ICD-10-CM

## 2012-10-29 DIAGNOSIS — Z9889 Other specified postprocedural states: Secondary | ICD-10-CM

## 2012-10-29 DIAGNOSIS — Z953 Presence of xenogenic heart valve: Secondary | ICD-10-CM

## 2012-10-29 DIAGNOSIS — Z952 Presence of prosthetic heart valve: Secondary | ICD-10-CM

## 2012-10-29 MED ORDER — WARFARIN SODIUM 4 MG PO TABS: 4.0000 mg | ORAL_TABLET | ORAL | Status: DC

## 2012-10-29 NOTE — Progress Notes (Signed)
This patient a prior pt of Dr Andee Lineman, he states he's been out of his coumadin for 2-3 days, his son says longer, INR 1.1, I boosted him for 2 days, started him back on prior dose we have recorded and will see him in 1 week, reinforced anticoagulation med mgmt with pt and son, they understand interactions with mother medications and leafy green vegetables, they are going to re establish with a cardiologist here and make appt today.

## 2012-10-30 ENCOUNTER — Other Ambulatory Visit: Payer: Self-pay | Admitting: *Deleted

## 2012-10-30 MED ORDER — ATORVASTATIN CALCIUM 20 MG PO TABS: ORAL_TABLET | ORAL | Status: DC

## 2012-11-08 ENCOUNTER — Ambulatory Visit (INDEPENDENT_AMBULATORY_CARE_PROVIDER_SITE_OTHER): Payer: Medicare Other | Admitting: *Deleted

## 2012-11-08 DIAGNOSIS — Z952 Presence of prosthetic heart valve: Secondary | ICD-10-CM

## 2012-11-08 DIAGNOSIS — Z9889 Other specified postprocedural states: Secondary | ICD-10-CM

## 2012-11-08 DIAGNOSIS — Z7901 Long term (current) use of anticoagulants: Secondary | ICD-10-CM

## 2012-11-08 DIAGNOSIS — Z953 Presence of xenogenic heart valve: Secondary | ICD-10-CM

## 2012-11-08 LAB — POCT INR: INR: 2.3

## 2012-11-26 ENCOUNTER — Ambulatory Visit (INDEPENDENT_AMBULATORY_CARE_PROVIDER_SITE_OTHER): Payer: Medicare Other | Admitting: *Deleted

## 2012-11-26 DIAGNOSIS — Z953 Presence of xenogenic heart valve: Secondary | ICD-10-CM

## 2012-11-26 DIAGNOSIS — Z9889 Other specified postprocedural states: Secondary | ICD-10-CM

## 2012-11-26 DIAGNOSIS — Z952 Presence of prosthetic heart valve: Secondary | ICD-10-CM

## 2012-11-26 DIAGNOSIS — Z7901 Long term (current) use of anticoagulants: Secondary | ICD-10-CM

## 2012-11-26 LAB — POCT INR: INR: 2.8

## 2012-12-16 ENCOUNTER — Other Ambulatory Visit: Payer: Self-pay | Admitting: Cardiology

## 2012-12-16 MED ORDER — WARFARIN SODIUM 4 MG PO TABS: 4.0000 mg | ORAL_TABLET | ORAL | Status: DC

## 2012-12-24 ENCOUNTER — Ambulatory Visit: Payer: Medicare Other | Admitting: *Deleted

## 2012-12-24 ENCOUNTER — Ambulatory Visit: Payer: Medicare Other | Admitting: Cardiovascular Disease

## 2012-12-24 ENCOUNTER — Encounter: Payer: Self-pay | Admitting: Cardiology

## 2012-12-24 ENCOUNTER — Ambulatory Visit (INDEPENDENT_AMBULATORY_CARE_PROVIDER_SITE_OTHER): Payer: Medicare Other | Admitting: Cardiology

## 2012-12-24 VITALS — BP 137/87 | HR 66 | Ht 71.0 in | Wt 190.1 lb

## 2012-12-24 DIAGNOSIS — Z952 Presence of prosthetic heart valve: Secondary | ICD-10-CM

## 2012-12-24 DIAGNOSIS — Z953 Presence of xenogenic heart valve: Secondary | ICD-10-CM

## 2012-12-24 DIAGNOSIS — Z79899 Other long term (current) drug therapy: Secondary | ICD-10-CM

## 2012-12-24 DIAGNOSIS — I10 Essential (primary) hypertension: Secondary | ICD-10-CM

## 2012-12-24 DIAGNOSIS — I509 Heart failure, unspecified: Secondary | ICD-10-CM

## 2012-12-24 DIAGNOSIS — Z9889 Other specified postprocedural states: Secondary | ICD-10-CM

## 2012-12-24 DIAGNOSIS — Z7901 Long term (current) use of anticoagulants: Secondary | ICD-10-CM

## 2012-12-24 DIAGNOSIS — I2510 Atherosclerotic heart disease of native coronary artery without angina pectoris: Secondary | ICD-10-CM

## 2012-12-24 DIAGNOSIS — I5023 Acute on chronic systolic (congestive) heart failure: Secondary | ICD-10-CM

## 2012-12-24 LAB — POCT INR: INR: 2.1

## 2012-12-24 MED ORDER — ATORVASTATIN CALCIUM 40 MG PO TABS: ORAL_TABLET | ORAL | Status: AC

## 2012-12-24 MED ORDER — LISINOPRIL 20 MG PO TABS: 20.0000 mg | ORAL_TABLET | Freq: Every day | ORAL | Status: DC

## 2012-12-24 NOTE — Progress Notes (Addendum)
Clinical Summary [name] is a 79 y.o.male 1. CAD/ICM - prior CABG 08/2007 Last echo Jan 2014 shows severe LV dilatation, LVEF <20%.  - denies any significant DOE, though fairly sedentary. Mainly just walks around the house and does small chores. Denies any orthopnea, no PND, no LE edema. No chest pain - compliant w/ meds: coreg, lisinopril, atovra, and ASA 81.   2. AV replacement -bioprosthetic valve 08/2007 - denies DOE, no lightheadedness or dizziness, no chest pain.   3. Mitral valve repair - denies any current symptoms  5. Afib -prior Maze procedure 08/2011 - denies any palpitations, compliant w/ coumadin  6. HTN: does not check bp at home  7. HL: compliant w/ statin - TC 146 HDL 43 LDL 79  Past Medical History  Diagnosis Date  . CAD (coronary artery disease)     artery bypass graft  . Decreased left ventricular function   . Shortness of breath   . Heart murmur   . Hypertension   . S/P aortic valve replacement with bioprosthetic valve 08/15/2007    25 mm Edwards Magna pericardial tissue valve for bicuspid aortic valve disease with severe aortic insufficiency  . Chronic kidney disease (CKD)   . Atrial fibrillation, persistent   . Congestive heart failure 08/18/2011    Acute on chronic systolic and diastolic  . Mitral regurgitation 08/18/2011  . Coronary artery disease 08/18/2011    S/p CABG x 3 on 08/15/2007 - LIMA to LAD, SVG to OM, SVG to LPDA  . Peptic ulcer disease 08/18/2011    Remote history of partial gastrectomy for ulcer disease  . S/P partial gastrectomy 08/18/2011  . S/P mitral valve repair 08/25/2011    Complex valvuloplasty including triangular resection of flail commissural leaflet, Goretex neocord replacement x2 and 28 mm Sorin Memo 3D ring annuloplasty via right mini thoracotomy  . S/P Maze operation for atrial fibrillation 08/25/2011    Complete biatrial lesion set using cryothermy     No Known Allergies   Current Outpatient Prescriptions    Medication Sig Dispense Refill  . aspirin EC 81 MG tablet Take 81 mg by mouth daily.      [name] atorvastatin (LIPITOR) 20 MG tablet TAKE ONE TABLET BY MOUTH DAILY  30 tablet  2  . carvedilol (COREG) 6.25 MG tablet Take 1 tablet (6.25 mg total) by mouth 2 (two) times daily.      [name] lisinopril (PRINIVIL,ZESTRIL) 20 MG tablet Take 0.5 tablets (10 mg total) by mouth daily.  30 tablet  0  . warfarin (COUMADIN) 4 MG tablet Take 1 tablet (4 mg total) by mouth as directed.  30 tablet  3   No current facility-administered medications for this visit.     Past Surgical History  Procedure Laterality Date  . Coronary artery bypass graft  08/15/2007    CABG x3  . Aortic valve replacement  08/15/2007    25 mm [facility]  . Cardiac catheterization    . Partial gastrectomy    . Cardioversion  11/2007  . Mitral valve repair  08/25/2011    Procedure: MINIMALLY INVASIVE MITRAL VALVE REPAIR (MVR);  Surgeon: [name], MD;  Location: [facility] OR;  Service: Open Heart Surgery;  Laterality: Right;  . Maze  08/25/2011    Procedure: MAZE;  Surgeon: [name], MD;  Location: [facility] OR;  Service: Open Heart Surgery;  Laterality: N/A;     No Known Allergies    No family history on file.   Social  History [name] reports that he quit smoking about 44 years ago. His smoking use included Cigarettes. He has a 30 pack-year smoking history. He has never used smokeless tobacco. [name] reports that he does not drink alcohol.   Review of Systems 12 point ROS negative other than reported in HPI  Physical Examination p 66 137/87    Gen: resting comfortably, NAD HEENT: no scleral icterus, pupils equal round and reactive, no palptable cervical adenopathy CV: RRR, 2/6 systolic murmur at apex, no jvd or carotid bruit Pulm: CTAB Abd: soft, NT, ND NABS, no hepatosplenomegaly Ext: warm, no edema.  Skin: warm, no rash Neuro: A&Ox3, no focal deficits    Diagnostic Studies Jan 2014 Echo: LVIDd 70mm,  LVEF <20%, multiple WMA, diastolic dysfunction, mild LAE, mild mitral MR, mild mitral stenosis, mild TR, normal AV function  12/24/12 EKG: SR, LBBB, PVCs, 1st degree AV block  Assessment and Plan  1. ICM: NYHA II symptoms, though somewhat difficult to evaluate due to patients overall sedentary lifestyle. He is euvolemic today. - will not further titrate beta blocker today due to low normal HR and 1st degree block on EKG. Will increase lisinopril to 20mg  qday, f/u 4 weeks w/ bp check and BMP - discussed ICD with the patient, he states that currently he would not be in favor of it. Will continue to address at follow up appts, continue to optimize medical therapy w/ careful titration in this elderly patient  2. AV replacement: no current symptoms, continue to follow clinically.   3. MV repair: no current symptoms, continue to follow clinically.   4. Afib: no current symptoms, contine beta blocker and coumadin. CHADS2 score is 3.  5. HTN: at goal, increasing ACE-I in setting of systolic dysfunction.  6. HL: goal <70, will increase to atorva 40mg  daily. Patient has known CAD, concern for side effects w/ high dose statin in this elderly patient, will increase just to moderate dose.    Antoine Poche, M.D., F.A.C.C.

## 2012-12-24 NOTE — Patient Instructions (Addendum)
Your physician recommends that you schedule a follow-up appointment in: 1 month with Dr. Wyline Mood. This appointment will be made today.  Your physician recommends that you return for lab work in: 3 weeks (around 01-14-2013) for a BMET. I will mail the order to you before Lab work is due.   Your physician has recommended you make the following change in your medication:  Increase Lisinopril to 20 MG once daily Increase Atorvastatin 40 MG once daily. A prescription for both has been sent to your pharmacy.   Continue all other medications the same.

## 2013-01-14 ENCOUNTER — Ambulatory Visit (INDEPENDENT_AMBULATORY_CARE_PROVIDER_SITE_OTHER): Payer: Medicare Other | Admitting: *Deleted

## 2013-01-14 DIAGNOSIS — Z7901 Long term (current) use of anticoagulants: Secondary | ICD-10-CM

## 2013-01-14 DIAGNOSIS — Z953 Presence of xenogenic heart valve: Secondary | ICD-10-CM

## 2013-01-14 DIAGNOSIS — Z952 Presence of prosthetic heart valve: Secondary | ICD-10-CM

## 2013-01-14 DIAGNOSIS — Z9889 Other specified postprocedural states: Secondary | ICD-10-CM

## 2013-01-14 LAB — POCT INR: INR: 1.5

## 2013-01-16 ENCOUNTER — Other Ambulatory Visit: Payer: Self-pay | Admitting: Cardiology

## 2013-01-16 ENCOUNTER — Encounter: Payer: Self-pay | Admitting: Cardiology

## 2013-01-16 DIAGNOSIS — I5023 Acute on chronic systolic (congestive) heart failure: Secondary | ICD-10-CM

## 2013-01-16 DIAGNOSIS — Z79899 Other long term (current) drug therapy: Secondary | ICD-10-CM

## 2013-01-28 ENCOUNTER — Ambulatory Visit (INDEPENDENT_AMBULATORY_CARE_PROVIDER_SITE_OTHER): Payer: Medicare Other | Admitting: *Deleted

## 2013-01-28 ENCOUNTER — Telehealth: Payer: Self-pay | Admitting: Cardiology

## 2013-01-28 DIAGNOSIS — Z9889 Other specified postprocedural states: Secondary | ICD-10-CM

## 2013-01-28 DIAGNOSIS — Z952 Presence of prosthetic heart valve: Secondary | ICD-10-CM

## 2013-01-28 DIAGNOSIS — Z953 Presence of xenogenic heart valve: Secondary | ICD-10-CM

## 2013-01-28 DIAGNOSIS — Z7901 Long term (current) use of anticoagulants: Secondary | ICD-10-CM

## 2013-01-28 LAB — POCT INR: INR: 3.6

## 2013-01-28 MED ORDER — CARVEDILOL 6.25 MG PO TABS: 6.2500 mg | ORAL_TABLET | Freq: Two times a day (BID) | ORAL | Status: DC

## 2013-01-28 NOTE — Telephone Encounter (Signed)
Refill done.  

## 2013-01-28 NOTE — Telephone Encounter (Signed)
Needs refills sent into [facility] Cardedilol 6.25 mg

## 2013-02-06 ENCOUNTER — Telehealth: Payer: Self-pay | Admitting: Cardiology

## 2013-02-06 NOTE — Telephone Encounter (Signed)
Called pt to see if lab work has been done. Pt was not at home and was unable to leave message.

## 2013-02-10 ENCOUNTER — Ambulatory Visit: Payer: Medicare Other | Admitting: Cardiology

## 2013-02-25 ENCOUNTER — Encounter: Payer: Self-pay | Admitting: Cardiology

## 2013-02-25 ENCOUNTER — Ambulatory Visit (INDEPENDENT_AMBULATORY_CARE_PROVIDER_SITE_OTHER): Payer: Medicare Other | Admitting: *Deleted

## 2013-02-25 ENCOUNTER — Ambulatory Visit (INDEPENDENT_AMBULATORY_CARE_PROVIDER_SITE_OTHER): Payer: Medicare Other | Admitting: Cardiology

## 2013-02-25 VITALS — BP 157/88 | HR 72 | Ht 71.0 in | Wt 191.0 lb

## 2013-02-25 DIAGNOSIS — Z9889 Other specified postprocedural states: Secondary | ICD-10-CM

## 2013-02-25 DIAGNOSIS — Z953 Presence of xenogenic heart valve: Secondary | ICD-10-CM

## 2013-02-25 DIAGNOSIS — E785 Hyperlipidemia, unspecified: Secondary | ICD-10-CM

## 2013-02-25 DIAGNOSIS — Z7901 Long term (current) use of anticoagulants: Secondary | ICD-10-CM

## 2013-02-25 DIAGNOSIS — Z952 Presence of prosthetic heart valve: Secondary | ICD-10-CM

## 2013-02-25 DIAGNOSIS — I255 Ischemic cardiomyopathy: Secondary | ICD-10-CM

## 2013-02-25 DIAGNOSIS — I10 Essential (primary) hypertension: Secondary | ICD-10-CM

## 2013-02-25 DIAGNOSIS — I2589 Other forms of chronic ischemic heart disease: Secondary | ICD-10-CM

## 2013-02-25 NOTE — Progress Notes (Signed)
Clinical Summary [name] is a 79 y.o.male returns today for follow up of the following medical problems.   1. CAD/ICM  - prior CABG 08/2007  Last echo Jan 2014 shows severe LV dilatation, LVEF <20%.  - denies any significant DOE, though fairly sedentary. Mainly just walks around the house and does small chores. Denies any orthopnea, no PND, no LE edema. No chest pain  - compliant w/ meds: coreg, lisinopril, atovra, and ASA 81.  - last visit increased lisinopril dose, he has not been to have his repeat labs yet  2. AV replacement  -bioprosthetic valve 08/2007  - denies DOE, no lightheadedness or dizziness, no chest pain.   3. Mitral valve repair  - denies any current symptoms   5. Afib  -prior Maze procedure 08/2011  - denies any palpitations since last visit, compliant w/ coumadin   6. HTN: does not check bp at home  - compliant with meds - has not taken his medications yet today.   7. HL: compliant w/ statin  - last visit increased atorvastatin to 40 mg daily, tolerating well without significant side effects.    Past Medical History  Diagnosis Date  . CAD (coronary artery disease)     artery bypass graft  . Decreased left ventricular function   . Shortness of breath   . Heart murmur   . Hypertension   . S/P aortic valve replacement with bioprosthetic valve 08/15/2007    25 mm Edwards Magna pericardial tissue valve for bicuspid aortic valve disease with severe aortic insufficiency  . Chronic kidney disease (CKD)   . Atrial fibrillation, persistent   . Congestive heart failure 08/18/2011    Acute on chronic systolic and diastolic  . Mitral regurgitation 08/18/2011  . Coronary artery disease 08/18/2011    S/p CABG x 3 on 08/15/2007 - LIMA to LAD, SVG to OM, SVG to LPDA  . Peptic ulcer disease 08/18/2011    Remote history of partial gastrectomy for ulcer disease  . S/P partial gastrectomy 08/18/2011  . S/P mitral valve repair 08/25/2011    Complex valvuloplasty  including triangular resection of flail commissural leaflet, Goretex neocord replacement x2 and 28 mm Sorin Memo 3D ring annuloplasty via right mini thoracotomy  . S/P Maze operation for atrial fibrillation 08/25/2011    Complete biatrial lesion set using cryothermy     No Known Allergies   Current Outpatient Prescriptions  Medication Sig Dispense Refill  . aspirin EC 81 MG tablet Take 81 mg by mouth daily.      [name] atorvastatin (LIPITOR) 40 MG tablet TAKE ONE TABLET BY MOUTH DAILY  30 tablet  6  . carvedilol (COREG) 6.25 MG tablet Take 1 tablet (6.25 mg total) by mouth 2 (two) times daily.  60 tablet  6  . lisinopril (PRINIVIL,ZESTRIL) 20 MG tablet Take 1 tablet (20 mg total) by mouth daily.  30 tablet  6  . warfarin (COUMADIN) 4 MG tablet Take 1 tablet (4 mg total) by mouth as directed.  30 tablet  3   No current facility-administered medications for this visit.     Past Surgical History  Procedure Laterality Date  . Coronary artery bypass graft  08/15/2007    CABG x3  . Aortic valve replacement  08/15/2007    25 mm [facility]  . Cardiac catheterization    . Partial gastrectomy    . Cardioversion  11/2007  . Mitral valve repair  08/25/2011    Procedure: MINIMALLY INVASIVE  MITRAL VALVE REPAIR (MVR);  Surgeon: [name], MD;  Location: [facility] OR;  Service: Open Heart Surgery;  Laterality: Right;  . Maze  08/25/2011    Procedure: MAZE;  Surgeon: [name], MD;  Location: [facility] OR;  Service: Open Heart Surgery;  Laterality: N/A;     No Known Allergies    No family history on file.   Social History [name] reports that he quit smoking about 44 years ago. His smoking use included Cigarettes. He has a 30 pack-year smoking history. He has never used smokeless tobacco. [name] reports that he does not drink alcohol.   Review of Systems CONSTITUTIONAL: No weight loss, fever, chills, weakness or fatigue.  HEENT: Eyes: No visual loss, blurred vision, double vision  or yellow sclerae.No hearing loss, sneezing, congestion, runny nose or sore throat.  SKIN: No rash or itching.  CARDIOVASCULAR: per HPI  RESPIRATORY: per HPI.  GASTROINTESTINAL: No anorexia, nausea, vomiting or diarrhea. No abdominal pain or blood.  GENITOURINARY: No burning on urination, no polyuria NEUROLOGICAL: No headache, dizziness, syncope, paralysis, ataxia, numbness or tingling in the extremities. No change in bowel or bladder control.  MUSCULOSKELETAL: No muscle, back pain, joint pain or stiffness.  LYMPHATICS: No enlarged nodes. No history of splenectomy.  PSYCHIATRIC: No history of depression or anxiety.  ENDOCRINOLOGIC: No reports of sweating, cold or heat intolerance. No polyuria or polydipsia.  [name]   Physical Examination p 72 bp 157/88 Wt 191 lbs BMI 27 Gen: resting comfortably, no acute distress HEENT: no scleral icterus, pupils equal round and reactive, no palptable cervical adenopathy,  CV: RRR, 2/6 systolic murmur RUSB, no JVD, no carotid bruits. Resp: Clear to auscultation bilaterally GI: abdomen is soft, non-tender, non-distended, normal bowel sounds, no hepatosplenomegaly MSK: extremities are warm, no edema.  Skin: warm, no rash Neuro:  no focal deficits Psych: appropriate affect   Diagnostic Studies Jan 2014 Echo: LVIDd 70mm, LVEF <20%, multiple WMA, diastolic dysfunction, mild LAE, mild mitral MR, mild mitral stenosis, mild TR, normal AV function    12/24/12 EKG: SR, LBBB, PVCs, 1st degree AV block     Assessment and Plan  1. ICM: NYHA II symptoms, though somewhat difficult to evaluate due to patients overall sedentary lifestyle. He is euvolemic today.  - have not further titratde beta blocker due to low normal HR and 1st degree block on EKG.  - tolerated increased lisinopril, he has not gone to have his repeat labs done yet, will follow up results - discussed ICD with the patient, he states that currently he would not be in favor of it. Will continue to  address at follow up appts, continue to optimize medical therapy w/ careful titration in this elderly patient   2. AV replacement: no current symptoms, continue to follow clinically.   3. MV repair: no current symptoms, continue to follow clinically.   4. Afib: no current symptoms, contine beta blocker and coumadin. CHADS2 score is 3.   5. HTN: elevated today but he has not taken his medications yet today. Asked him to please take prior to next appointments so that we may adjust his medications as needed.   6. HL: - increased atorva at last visit, will repeat lipid panel in a few months.        Antoine Poche, M.D., F.A.C.C.

## 2013-02-25 NOTE — Patient Instructions (Signed)
Your physician recommends that you schedule a follow-up appointment in: 3 months with Dr. Wyline Mood. This appointment will be scheduled today before you leave.  Your physician recommends that you continue on your current medications as directed. Please refer to the Current Medication list given to you today.  Your physician recommends that you return for lab work in: Today for [employer].  You can go to the following locations to get lab work done: [facility] [address] Dr. Lysbeth Galas office in [place] Or [facility].

## 2013-03-13 ENCOUNTER — Telehealth: Payer: Self-pay | Admitting: Cardiology

## 2013-03-13 NOTE — Telephone Encounter (Signed)
Message copied by Burnice Logan on Thu Mar 13, 2013  3:55 PM ------      Message from: Sarah Ann F      Created: Wed Mar 12, 2013  8:37 PM       Please let patient know that labs are stable. Kidney function is not normal but stable from prior tests ------

## 2013-03-13 NOTE — Telephone Encounter (Signed)
Left message for pt to return call.

## 2013-03-18 ENCOUNTER — Encounter: Payer: Self-pay | Admitting: Cardiology

## 2013-03-18 NOTE — Telephone Encounter (Signed)
Mailed pt a letter to inform pt of results.

## 2013-03-25 ENCOUNTER — Ambulatory Visit (INDEPENDENT_AMBULATORY_CARE_PROVIDER_SITE_OTHER): Payer: Medicare Other | Admitting: *Deleted

## 2013-03-25 DIAGNOSIS — Z9889 Other specified postprocedural states: Secondary | ICD-10-CM

## 2013-03-25 DIAGNOSIS — Z5181 Encounter for therapeutic drug level monitoring: Secondary | ICD-10-CM

## 2013-03-25 DIAGNOSIS — Z953 Presence of xenogenic heart valve: Secondary | ICD-10-CM

## 2013-03-25 DIAGNOSIS — Z7901 Long term (current) use of anticoagulants: Secondary | ICD-10-CM

## 2013-03-25 DIAGNOSIS — Z952 Presence of prosthetic heart valve: Secondary | ICD-10-CM

## 2013-04-01 ENCOUNTER — Other Ambulatory Visit: Payer: Self-pay | Admitting: *Deleted

## 2013-04-01 ENCOUNTER — Ambulatory Visit (INDEPENDENT_AMBULATORY_CARE_PROVIDER_SITE_OTHER): Payer: Medicare Other | Admitting: Cardiology

## 2013-04-01 DIAGNOSIS — Z23 Encounter for immunization: Secondary | ICD-10-CM

## 2013-04-01 NOTE — Progress Notes (Signed)
Patient come to office for flu shot. Flu shot given at 11:21 AM in Right Deltoid. Pt has no allergic reaction to eggs or latex. No prior diagnosis of GBS. Pt has never had allergic reaction to flu shots in past. Pt has not had any acute febrile recently. Information sheet given to pt. Informed pt that arm may be sore for the next few days and to move arm as much as he can. Pt verbalized understanding.

## 2013-04-15 ENCOUNTER — Ambulatory Visit (INDEPENDENT_AMBULATORY_CARE_PROVIDER_SITE_OTHER): Payer: Medicare Other | Admitting: *Deleted

## 2013-04-15 DIAGNOSIS — Z7901 Long term (current) use of anticoagulants: Secondary | ICD-10-CM

## 2013-04-15 DIAGNOSIS — Z5181 Encounter for therapeutic drug level monitoring: Secondary | ICD-10-CM

## 2013-04-15 DIAGNOSIS — Z9889 Other specified postprocedural states: Secondary | ICD-10-CM

## 2013-04-15 DIAGNOSIS — Z953 Presence of xenogenic heart valve: Secondary | ICD-10-CM

## 2013-04-15 DIAGNOSIS — Z952 Presence of prosthetic heart valve: Secondary | ICD-10-CM

## 2013-04-15 LAB — POCT INR: INR: 3.1

## 2013-05-13 ENCOUNTER — Ambulatory Visit (INDEPENDENT_AMBULATORY_CARE_PROVIDER_SITE_OTHER): Payer: Medicare Other | Admitting: *Deleted

## 2013-05-13 DIAGNOSIS — Z953 Presence of xenogenic heart valve: Secondary | ICD-10-CM

## 2013-05-13 DIAGNOSIS — Z952 Presence of prosthetic heart valve: Secondary | ICD-10-CM

## 2013-05-13 DIAGNOSIS — Z9889 Other specified postprocedural states: Secondary | ICD-10-CM

## 2013-05-13 DIAGNOSIS — Z5181 Encounter for therapeutic drug level monitoring: Secondary | ICD-10-CM

## 2013-05-13 DIAGNOSIS — Z7901 Long term (current) use of anticoagulants: Secondary | ICD-10-CM

## 2013-05-13 LAB — POCT INR: INR: 4.7

## 2013-06-02 ENCOUNTER — Encounter: Payer: Self-pay | Admitting: Cardiology

## 2013-06-02 ENCOUNTER — Ambulatory Visit (INDEPENDENT_AMBULATORY_CARE_PROVIDER_SITE_OTHER): Payer: Medicare Other | Admitting: *Deleted

## 2013-06-02 ENCOUNTER — Ambulatory Visit (INDEPENDENT_AMBULATORY_CARE_PROVIDER_SITE_OTHER): Payer: Medicare Other | Admitting: Cardiology

## 2013-06-02 VITALS — BP 113/69 | HR 67 | Ht 71.0 in | Wt 185.0 lb

## 2013-06-02 DIAGNOSIS — Z5181 Encounter for therapeutic drug level monitoring: Secondary | ICD-10-CM

## 2013-06-02 DIAGNOSIS — Z7901 Long term (current) use of anticoagulants: Secondary | ICD-10-CM

## 2013-06-02 DIAGNOSIS — I10 Essential (primary) hypertension: Secondary | ICD-10-CM

## 2013-06-02 DIAGNOSIS — E785 Hyperlipidemia, unspecified: Secondary | ICD-10-CM

## 2013-06-02 DIAGNOSIS — Z9889 Other specified postprocedural states: Secondary | ICD-10-CM

## 2013-06-02 DIAGNOSIS — I4891 Unspecified atrial fibrillation: Secondary | ICD-10-CM

## 2013-06-02 DIAGNOSIS — Z952 Presence of prosthetic heart valve: Secondary | ICD-10-CM

## 2013-06-02 DIAGNOSIS — I2589 Other forms of chronic ischemic heart disease: Secondary | ICD-10-CM

## 2013-06-02 DIAGNOSIS — I255 Ischemic cardiomyopathy: Secondary | ICD-10-CM

## 2013-06-02 DIAGNOSIS — Z953 Presence of xenogenic heart valve: Secondary | ICD-10-CM

## 2013-06-02 LAB — POCT INR: INR: 3.7

## 2013-06-02 NOTE — Progress Notes (Signed)
Clinical Summary Mr. Murch is a 80 y.o.male seen today for follow up of the following medical problems.  1. CAD/ICM  - prior CABG 08/2007  Last echo Jan 2014 shows severe LV dilatation, LVEF <20%.  - denies any significant DOE, though fairly sedentary. Mainly just walks around the house and does small chores. Denies any orthopnea, no PND, no LE edema. No chest pain  - compliant w/ meds, last visit we had documented he was on lisinopril however appears he is on losartan 25mg  daily after contacting his pharmacy.    2. AV replacement  -bioprosthetic valve 08/2007  - denies DOE, no lightheadedness or dizziness, no chest pain.   3. Mitral valve repair  - denies any current symptoms   5. Afib  -prior Maze procedure 08/2011  - denies any palpitations since last visit, compliant w/ coumadin   6. HTN: does not check bp at home  - compliant with meds    7. HL: compliant w/ statin  - tolerating well without significant side effects.       Past Medical History  Diagnosis Date  . CAD (coronary artery disease)     artery bypass graft  . Decreased left ventricular function   . Shortness of breath   . Heart murmur   . Hypertension   . S/P aortic valve replacement with bioprosthetic valve 08/15/2007    25 mm Edwards Magna pericardial tissue valve for bicuspid aortic valve disease with severe aortic insufficiency  . Chronic kidney disease (CKD)   . Atrial fibrillation, persistent   . Congestive heart failure 08/18/2011    Acute on chronic systolic and diastolic  . Mitral regurgitation 08/18/2011  . Coronary artery disease 08/18/2011    S/p CABG x 3 on 08/15/2007 - LIMA to LAD, SVG to OM, SVG to LPDA  . Peptic ulcer disease 08/18/2011    Remote history of partial gastrectomy for ulcer disease  . S/P partial gastrectomy 08/18/2011  . S/P mitral valve repair 08/25/2011    Complex valvuloplasty including triangular resection of flail commissural leaflet, Goretex neocord replacement x2  and 28 mm Sorin Memo 3D ring annuloplasty via right mini thoracotomy  . S/P Maze operation for atrial fibrillation 08/25/2011    Complete biatrial lesion set using cryothermy     No Known Allergies   Current Outpatient Prescriptions  Medication Sig Dispense Refill  . aspirin EC 81 MG tablet Take 81 mg by mouth daily.      [name] atorvastatin (LIPITOR) 40 MG tablet TAKE ONE TABLET BY MOUTH DAILY  30 tablet  6  . carvedilol (COREG) 6.25 MG tablet Take 1 tablet (6.25 mg total) by mouth 2 (two) times daily.  60 tablet  6  . lisinopril (PRINIVIL,ZESTRIL) 20 MG tablet Take 1 tablet (20 mg total) by mouth daily.  30 tablet  6  . warfarin (COUMADIN) 4 MG tablet Take 1 tablet (4 mg total) by mouth as directed.  30 tablet  3   No current facility-administered medications for this visit.     Past Surgical History  Procedure Laterality Date  . Coronary artery bypass graft  08/15/2007    CABG x3  . Aortic valve replacement  08/15/2007    25 mm [facility]  . Cardiac catheterization    . Partial gastrectomy    . Cardioversion  11/2007  . Mitral valve repair  08/25/2011    Procedure: MINIMALLY INVASIVE MITRAL VALVE REPAIR (MVR);  Surgeon: [name], MD;  Location: [facility]  OR;  Service: Open Heart Surgery;  Laterality: Right;  . Maze  08/25/2011    Procedure: MAZE;  Surgeon: Purcell Nailslarence H Owen, MD;  [facility];  Service: Open Heart Surgery;  Laterality: N/A;     No Known Allergies    No family history on file.   Social History Mr. Darl HouseholderScearce reports that he quit smoking about 45 years ago. His smoking use included Cigarettes. He has a 30 pack-year smoking history. He has never used smokeless tobacco. Mr. Darl HouseholderScearce reports that he does not drink alcohol.   Review of Systems CONSTITUTIONAL: No weight loss, fever, chills, weakness or fatigue.  HEENT: Eyes: No visual loss, blurred vision, double vision or yellow sclerae.No hearing loss, sneezing, congestion, runny nose or sore throat.  SKIN:  No rash or itching.  CARDIOVASCULAR: per HPI RESPIRATORY: No shortness of breath, cough or sputum.  GASTROINTESTINAL: No anorexia, nausea, vomiting or diarrhea. No abdominal pain or blood.  GENITOURINARY: No burning on urination, no polyuria NEUROLOGICAL: No headache, dizziness, syncope, paralysis, ataxia, numbness or tingling in the extremities. No change in bowel or bladder control.  MUSCULOSKELETAL: No muscle, back pain, joint pain or stiffness.  LYMPHATICS: No enlarged nodes. No history of splenectomy.  PSYCHIATRIC: No history of depression or anxiety.  ENDOCRINOLOGIC: No reports of sweating, cold or heat intolerance. No polyuria or polydipsia.  [name].   Physical Examination p 67 bp 113/69 Wt 185 lbs BMI 26 Gen: resting comfortably, no acute distress HEENT: no scleral icterus, pupils equal round and reactive, no palptable cervical adenopathy,  CV: RRR, no m/r/g, no JVD, no carotid bruits Resp: Clear to auscultation bilaterally GI: abdomen is soft, non-tender, non-distended, normal bowel sounds, no hepatosplenomegaly MSK: extremities are warm, no edema.  Skin: warm, no rash Neuro:  no focal deficits Psych: appropriate affect   Diagnostic Studies Jan 2014 Echo: LVIDd 70mm, LVEF <20%, multiple WMA, diastolic dysfunction, mild LAE, mild mitral MR, mild mitral stenosis, mild TR, normal AV function   12/24/12 EKG: SR, LBBB, PVCs, 1st degree AV block        Assessment and Plan  1. ICM: NYHA II symptoms, though somewhat difficult to evaluate due to patients overall sedentary lifestyle. He is euvolemic today.  - have not further titratde beta blocker due to low normal HR and 1st degree block on EKG.  - limited titration of ARB due to renal insufficiency, will continue current dose. Avoiding spironolactone at this time.  - discussed ICD with the patient, he states that currently he would not be in favor of it. Will continue to address at follow up appts, continue to optimize medical  therapy w/ careful titration in this elderly patient    2. AV replacement: no current symptoms, continue to follow clinically.   3. MV repair: no current symptoms, continue to follow clinically.   4. Afib: no current symptoms, contine beta blocker and coumadin. CHADS2 score is 3. Denies any bleeding issues.  - given prior hx of CAD will continue low dose ASA, he has not had any bleeding issues  5. HTN:  - at goal, continue current meds  6. HL:  - repeat lipid panel   Follow up 6 months         Antoine PocheJonathan F. Alysiana Ethridge, M.D., F.A.C.C.

## 2013-06-02 NOTE — Patient Instructions (Signed)
Your physician recommends that you schedule a follow-up appointment in: 6 months with Dr. Wyline MoodBranch. You should receive a letter in the mail in 4 months. If you do not receive this letter by June 2015 call our office to schedule this appointment.   Your physician recommends that you continue on your current medications as directed. Please refer to the Current Medication list given to you today.  You have been given a list of primary care physician's.  Your physician recommends that you return for lab work in: tomorrw for fasting lipid, CMET and CBC. Nothing to eat or drink after midnight night before labs are done.  You can go to the following locations to get lab work done: [facility] [address] Dr. [place]beth [place] [facility].

## 2013-06-17 ENCOUNTER — Telehealth: Payer: Self-pay | Admitting: Cardiology

## 2013-06-17 ENCOUNTER — Encounter: Payer: Self-pay | Admitting: Cardiology

## 2013-06-17 NOTE — Telephone Encounter (Signed)
Called and left pt a message asking if lab work has been completed. Also mailed letter with lab orders enclosed to pt.

## 2013-06-18 ENCOUNTER — Other Ambulatory Visit: Payer: Self-pay | Admitting: Cardiology

## 2013-06-18 MED ORDER — WARFARIN SODIUM 4 MG PO TABS: 4.0000 mg | ORAL_TABLET | ORAL | Status: DC

## 2013-06-24 ENCOUNTER — Ambulatory Visit (INDEPENDENT_AMBULATORY_CARE_PROVIDER_SITE_OTHER): Payer: Medicare Other | Admitting: *Deleted

## 2013-06-24 DIAGNOSIS — Z7901 Long term (current) use of anticoagulants: Secondary | ICD-10-CM

## 2013-06-24 DIAGNOSIS — Z952 Presence of prosthetic heart valve: Secondary | ICD-10-CM

## 2013-06-24 DIAGNOSIS — Z5181 Encounter for therapeutic drug level monitoring: Secondary | ICD-10-CM

## 2013-06-24 DIAGNOSIS — Z9889 Other specified postprocedural states: Secondary | ICD-10-CM

## 2013-06-24 DIAGNOSIS — Z953 Presence of xenogenic heart valve: Secondary | ICD-10-CM

## 2013-06-24 LAB — POCT INR: INR: 1.8

## 2013-06-30 ENCOUNTER — Telehealth: Payer: Self-pay | Admitting: Cardiology

## 2013-06-30 ENCOUNTER — Encounter: Payer: Self-pay | Admitting: Cardiology

## 2013-06-30 NOTE — Telephone Encounter (Signed)
Pt informed of results. Pt verbalized understanding.Pt dose not have a PCP. I informed pt that I would mail him a list of some PCP in our area.

## 2013-06-30 NOTE — Telephone Encounter (Signed)
Message copied by Burnice LoganATES, Bladimir Auman M on Mon Jun 30, 2013  5:17 PM ------      Message from: North EdwardsBRANCH, JONATHAN F      Created: Mon Jun 30, 2013  9:40 AM       Please let patient know that cholesterol looks good. Renal function is mildly decreased but stable from prior labs. One of his liver tests is elevated (bilirubin), please forward results to PCP and have him follow up w/ pcp.            Dina RichJonathan Branch MD ------

## 2013-07-15 ENCOUNTER — Ambulatory Visit (INDEPENDENT_AMBULATORY_CARE_PROVIDER_SITE_OTHER): Payer: Medicare Other | Admitting: *Deleted

## 2013-07-15 DIAGNOSIS — Z9889 Other specified postprocedural states: Secondary | ICD-10-CM

## 2013-07-15 DIAGNOSIS — Z5181 Encounter for therapeutic drug level monitoring: Secondary | ICD-10-CM

## 2013-07-15 DIAGNOSIS — Z952 Presence of prosthetic heart valve: Secondary | ICD-10-CM

## 2013-07-15 DIAGNOSIS — Z7901 Long term (current) use of anticoagulants: Secondary | ICD-10-CM

## 2013-07-15 DIAGNOSIS — Z953 Presence of xenogenic heart valve: Secondary | ICD-10-CM

## 2013-07-15 LAB — POCT INR: INR: 4.7

## 2013-07-23 ENCOUNTER — Other Ambulatory Visit: Payer: Self-pay | Admitting: Cardiology

## 2013-08-05 ENCOUNTER — Ambulatory Visit (INDEPENDENT_AMBULATORY_CARE_PROVIDER_SITE_OTHER): Payer: Medicare Other | Admitting: *Deleted

## 2013-08-05 DIAGNOSIS — Z953 Presence of xenogenic heart valve: Secondary | ICD-10-CM

## 2013-08-05 DIAGNOSIS — Z5181 Encounter for therapeutic drug level monitoring: Secondary | ICD-10-CM

## 2013-08-05 DIAGNOSIS — Z9889 Other specified postprocedural states: Secondary | ICD-10-CM

## 2013-08-05 DIAGNOSIS — Z952 Presence of prosthetic heart valve: Secondary | ICD-10-CM

## 2013-08-05 DIAGNOSIS — Z7901 Long term (current) use of anticoagulants: Secondary | ICD-10-CM

## 2013-08-05 LAB — POCT INR: INR: 2.3

## 2013-08-26 ENCOUNTER — Ambulatory Visit (INDEPENDENT_AMBULATORY_CARE_PROVIDER_SITE_OTHER): Payer: Medicare Other | Admitting: *Deleted

## 2013-08-26 DIAGNOSIS — Z7901 Long term (current) use of anticoagulants: Secondary | ICD-10-CM

## 2013-08-26 DIAGNOSIS — Z952 Presence of prosthetic heart valve: Secondary | ICD-10-CM

## 2013-08-26 DIAGNOSIS — Z5181 Encounter for therapeutic drug level monitoring: Secondary | ICD-10-CM

## 2013-08-26 DIAGNOSIS — Z9889 Other specified postprocedural states: Secondary | ICD-10-CM

## 2013-08-26 DIAGNOSIS — Z953 Presence of xenogenic heart valve: Secondary | ICD-10-CM

## 2013-08-26 LAB — POCT INR: INR: 2.7

## 2013-11-14 ENCOUNTER — Encounter: Payer: Self-pay | Admitting: Cardiology

## 2013-11-14 ENCOUNTER — Ambulatory Visit (INDEPENDENT_AMBULATORY_CARE_PROVIDER_SITE_OTHER): Payer: Medicare Other | Admitting: Cardiology

## 2013-11-14 VITALS — BP 149/91 | HR 61 | Ht 71.0 in | Wt 182.0 lb

## 2013-11-14 DIAGNOSIS — Z9889 Other specified postprocedural states: Secondary | ICD-10-CM

## 2013-11-14 DIAGNOSIS — I5022 Chronic systolic (congestive) heart failure: Secondary | ICD-10-CM

## 2013-11-14 DIAGNOSIS — Z953 Presence of xenogenic heart valve: Secondary | ICD-10-CM

## 2013-11-14 DIAGNOSIS — I10 Essential (primary) hypertension: Secondary | ICD-10-CM

## 2013-11-14 DIAGNOSIS — Z952 Presence of prosthetic heart valve: Secondary | ICD-10-CM

## 2013-11-14 DIAGNOSIS — I2589 Other forms of chronic ischemic heart disease: Secondary | ICD-10-CM

## 2013-11-14 DIAGNOSIS — I4891 Unspecified atrial fibrillation: Secondary | ICD-10-CM

## 2013-11-14 DIAGNOSIS — E785 Hyperlipidemia, unspecified: Secondary | ICD-10-CM

## 2013-11-14 DIAGNOSIS — I2510 Atherosclerotic heart disease of native coronary artery without angina pectoris: Secondary | ICD-10-CM

## 2013-11-14 NOTE — Patient Instructions (Signed)
Continue all current medications. Your physician wants you to follow up in: 6 months.  You will receive a reminder letter in the mail one-two months in advance.  If you don't receive a letter, please call our office to schedule the follow up appointment   

## 2013-11-14 NOTE — Progress Notes (Signed)
Clinical Summary Mr. Cinque is a 80 y.o.male seen today for follow up of the following medical problems.   1. CAD/ICM  - prior CABG 08/2007  Last echo Jan 2014 shows severe LV dilatation, LVEF <20%. Overall stable function - denies any significant DOE, though fairly sedentary. Mainly just walks around the house and does small chores. Denies any orthopnea, no PND, no LE edema. No chest pain   - compliant w/ meds. Beta blocker titration limited due to low heart rates. Have not been aggressive titrating ARB in setting of renal dysfunction, not on aldactone for same reason.   2. AV replacement  -bioprosthetic valve 08/2007  - denies DOE, no lightheadedness or dizziness, no chest pain.   3. Mitral valve repair  - denies any current symptoms   5. Afib  -prior Maze procedure 08/2011  - denies any palpitations since last visit, compliant w/ coumadin   6. HTN: does not check bp at home  - compliant with meds, not sure if he took yet today.   7. HL: compliant w/ statin  - tolerating well without significant side effects.  - Last lipid panel3/2015 TC 118 TG 116 HDL 39 LDL 56  8. Elevated bilirubin - newly established at [facility]. We had noted elevated bilirubin on his last visit and asked him to follow up with pcp however we were informed at that time he did not have one - since that time has established, he is unclear if his bilrubin was addressed  Past Medical History  Diagnosis Date  . CAD (coronary artery disease)     artery bypass graft  . Decreased left ventricular function   . Shortness of breath   . Heart murmur   . Hypertension   . S/P aortic valve replacement with bioprosthetic valve 08/15/2007    25 mm Edwards Magna pericardial tissue valve for bicuspid aortic valve disease with severe aortic insufficiency  . Chronic kidney disease (CKD)   . Atrial fibrillation, persistent   . Congestive heart failure 08/18/2011    Acute on chronic systolic and diastolic   . Mitral regurgitation 08/18/2011  . Coronary artery disease 08/18/2011    S/p CABG x 3 on 08/15/2007 - LIMA to LAD, SVG to OM, SVG to LPDA  . Peptic ulcer disease 08/18/2011    Remote history of partial gastrectomy for ulcer disease  . S/P partial gastrectomy 08/18/2011  . S/P mitral valve repair 08/25/2011    Complex valvuloplasty including triangular resection of flail commissural leaflet, Goretex neocord replacement x2 and 28 mm Sorin Memo 3D ring annuloplasty via right mini thoracotomy  . S/P Maze operation for atrial fibrillation 08/25/2011    Complete biatrial lesion set using cryothermy     No Known Allergies   Current Outpatient Prescriptions  Medication Sig Dispense Refill  . aspirin EC 81 MG tablet Take 81 mg by mouth daily.      [name] atorvastatin (LIPITOR) 40 MG tablet TAKE ONE TABLET BY MOUTH DAILY  30 tablet  6  . carvedilol (COREG) 6.25 MG tablet Take 1 tablet (6.25 mg total) by mouth 2 (two) times daily.  60 tablet  6  . losartan (COZAAR) 25 MG tablet TAKE ONE TABLET BY MOUTH DAILY  30 tablet  6  . warfarin (COUMADIN) 4 MG tablet Take 1 tablet (4 mg total) by mouth as directed.  30 tablet  3   No current facility-administered medications for this visit.     Past Surgical History  Procedure Laterality Date  . Coronary artery bypass graft  08/15/2007    CABG x3  . Aortic valve replacement  08/15/2007    [facility]  . Cardiac catheterization    . Partial gastrectomy    . Cardioversion  11/2007  . Mitral valve repair  08/25/2011    Procedure: MINIMALLY INVASIVE MITRAL VALVE REPAIR (MVR);  Surgeon: Purcell Nailslarence H Owen, MD;  [facility];  Service: Open Heart Surgery;  Laterality: Right;  . Maze  08/25/2011    Procedure: MAZE;  Surgeon: Purcell Nailslarence H Owen, MD;  [facility];  Service: Open Heart Surgery;  Laterality: N/A;     No Known Allergies    No family history on file.   Social History Mr. Darl HouseholderScearce reports that he quit smoking about 45 years ago. His  smoking use included Cigarettes. He has a 30 pack-year smoking history. He has never used smokeless tobacco. Mr. Darl HouseholderScearce reports that he does not drink alcohol.   Review of Systems CONSTITUTIONAL: No weight loss, fever, chills, weakness or fatigue.  HEENT: Eyes: No visual loss, blurred vision, double vision or yellow sclerae.No hearing loss, sneezing, congestion, runny nose or sore throat.  SKIN: No rash or itching.  CARDIOVASCULAR: per HPI RESPIRATORY: No shortness of breath, cough or sputum.  GASTROINTESTINAL: No anorexia, nausea, vomiting or diarrhea. No abdominal pain or blood.  GENITOURINARY: No burning on urination, no polyuria NEUROLOGICAL: No headache, dizziness, syncope, paralysis, ataxia, numbness or tingling in the extremities. No change in bowel or bladder control.  MUSCULOSKELETAL: No muscle, back pain, joint pain or stiffness.  LYMPHATICS: No enlarged nodes. No history of splenectomy.  PSYCHIATRIC: No history of depression or anxiety.  ENDOCRINOLOGIC: No reports of sweating, cold or heat intolerance. No polyuria or polydipsia.  [name].   Physical Examination p 61 bp 149/91 Wt 182 lbs BMI 25 Gen: resting comfortably, no acute distress HEENT: no scleral icterus, pupils equal round and reactive, no palptable cervical adenopathy,  CV: RRR, 2/6 systolic murmur at apex, no JVD Resp: Clear to auscultation bilaterally GI: abdomen is soft, non-tender, non-distended, normal bowel sounds, no hepatosplenomegaly MSK: extremities are warm, no edema.  Skin: warm, no rash Neuro:  no focal deficits Psych: appropriate affect   Diagnostic Studies Jan 2014 Echo: LVIDd 70mm, LVEF <20%, multiple WMA, diastolic dysfunction, mild LAE, mild mitral MR, mild mitral stenosis, mild TR, normal AV function   12/24/12 EKG: SR, LBBB, PVCs, 1st degree AV block      Assessment and Plan  1. ICM: NYHA II symptoms, though somewhat difficult to evaluate due to patients overall sedentary lifestyle. He is  euvolemic today.  - have not further titrated beta blocker due to low normal HR and 1st degree block on EKG.  - limited titration of ARB due to renal insufficiency, will continue current dose. Avoiding spironolactone at this time.  - I have discussed ICD with patient, he states that currently he would not be in favor of it. Will continue to address at follow up appts, continue to optimize medical therapy w/ careful titration in this elderly patient   2. AV replacement: no current symptoms, continue to follow clinically.   3. MV repair: no current symptoms, continue to follow clinically.   4. Afib: no current symptoms, contine beta blocker and coumadin. CHADS2 score is 3. Denies any bleeding issues.  - given prior hx of CAD will continue low dose ASA, he has not had any bleeding issues   5. HTN:  - at  goal, continue current meds   6. HL:  - at goal, continue current statin  7. Elevated bilirubin - we will forward labs and my clinic note to his new pcp    Antoine Poche, M.D., F.A.C.C.

## 2014-03-19 ENCOUNTER — Encounter (HOSPITAL_COMMUNITY): Payer: Self-pay | Admitting: Cardiology

## 2014-07-22 ENCOUNTER — Encounter: Payer: Self-pay | Admitting: *Deleted

## 2014-07-22 ENCOUNTER — Ambulatory Visit (INDEPENDENT_AMBULATORY_CARE_PROVIDER_SITE_OTHER): Payer: Medicare Other | Admitting: Cardiology

## 2014-07-22 ENCOUNTER — Encounter: Payer: Self-pay | Admitting: Cardiology

## 2014-07-22 VITALS — BP 134/73 | HR 67 | Ht 71.0 in | Wt 186.0 lb

## 2014-07-22 DIAGNOSIS — I2510 Atherosclerotic heart disease of native coronary artery without angina pectoris: Secondary | ICD-10-CM | POA: Diagnosis not present

## 2014-07-22 DIAGNOSIS — E785 Hyperlipidemia, unspecified: Secondary | ICD-10-CM

## 2014-07-22 DIAGNOSIS — R7309 Other abnormal glucose: Secondary | ICD-10-CM

## 2014-07-22 DIAGNOSIS — I5023 Acute on chronic systolic (congestive) heart failure: Secondary | ICD-10-CM | POA: Diagnosis not present

## 2014-07-22 DIAGNOSIS — I5022 Chronic systolic (congestive) heart failure: Secondary | ICD-10-CM | POA: Diagnosis not present

## 2014-07-22 DIAGNOSIS — I10 Essential (primary) hypertension: Secondary | ICD-10-CM

## 2014-07-22 DIAGNOSIS — Z131 Encounter for screening for diabetes mellitus: Secondary | ICD-10-CM | POA: Diagnosis not present

## 2014-07-22 NOTE — Patient Instructions (Signed)
Your physician wants you to follow-up in: 6 MONTHS WITH DR. BRANCH You will receive a reminder letter in the mail two months in advance. If you don't receive a letter, please call our office to schedule the follow-up appointment.  Your physician has recommended you make the following change in your medication:   STOP TAKING ASPIRIN  CONTINUE ALL OTHER MEDICATIONS AS DIRECTED  Your physician recommends that you return for lab work CBC/CMP/TSH/MAG/LIPIDS/HGBA1C  PLEASE FAST FOR LAB WORK  Thank you for choosing [facility]!!

## 2014-07-22 NOTE — Progress Notes (Signed)
Clinical Summary [name] is a 81 y.o.male seen today for follow up of the following medical problems.   1. CAD/ICM  - prior CABG 08/2007  Last echo Jan 2014 shows severe LV dilatation, LVEF <20%. Overall stable function - compliant w/ meds. Beta blocker titration limited due to low heart rates. Have not been aggressive titrating ARB in setting of renal dysfunction, not on aldactone for same reason.   - denies any significant SOB or DOE. No chest pain   2. AV replacement  -bioprosthetic valve 08/2007  - denies DOE, no lightheadedness or dizziness, no chest pain.   3. Mitral valve repair  - no current symptoms  5. Afib  -prior Maze procedure 08/2011  - denies any palpitations. Compliant with coumadin, no bleeding issues  6. HTN:  - does not check bp at home  - compliant with meds  7. HL:   - tolerating statin well without significant side effects.  - Last lipid panel 06/2013 TC 118 TG 116 HDL 39 LDL 56  Past Medical History  Diagnosis Date  . CAD (coronary artery disease)     artery bypass graft  . Decreased left ventricular function   . Shortness of breath   . Heart murmur   . Hypertension   . S/P aortic valve replacement with bioprosthetic valve 08/15/2007    25 mm Edwards Magna pericardial tissue valve for bicuspid aortic valve disease with severe aortic insufficiency  . Chronic kidney disease (CKD)   . Atrial fibrillation, persistent   . Congestive heart failure 08/18/2011    Acute on chronic systolic and diastolic  . Mitral regurgitation 08/18/2011  . Coronary artery disease 08/18/2011    S/p CABG x 3 on 08/15/2007 - LIMA to LAD, SVG to OM, SVG to LPDA  . Peptic ulcer disease 08/18/2011    Remote history of partial gastrectomy for ulcer disease  . S/P partial gastrectomy 08/18/2011  . S/P mitral valve repair 08/25/2011    Complex valvuloplasty including triangular resection of flail commissural leaflet, Goretex neocord replacement x2 and 28 mm Sorin  Memo 3D ring annuloplasty via right mini thoracotomy  . S/P Maze operation for atrial fibrillation 08/25/2011    Complete biatrial lesion set using cryothermy     No Known Allergies   Current Outpatient Prescriptions  Medication Sig Dispense Refill  . aspirin EC 81 MG tablet Take 81 mg by mouth daily.    [name]. atorvastatin (LIPITOR) 40 MG tablet TAKE ONE TABLET BY MOUTH DAILY 30 tablet 6  . carvedilol (COREG) 6.25 MG tablet Take 1 tablet (6.25 mg total) by mouth 2 (two) times daily. 60 tablet 6  . losartan (COZAAR) 25 MG tablet TAKE ONE TABLET BY MOUTH DAILY 30 tablet 6  . warfarin (COUMADIN) 4 MG tablet Take 1 tablet (4 mg total) by mouth as directed. 30 tablet 3   No current facility-administered medications for this visit.     Past Surgical History  Procedure Laterality Date  . Coronary artery bypass graft  08/15/2007    CABG x3  . Aortic valve replacement  08/15/2007    [facility] mm [facility]  . Cardiac catheterization    . Partial gastrectomy    . Cardioversion  11/2007  . Mitral valve repair  08/25/2011    Procedure: MINIMALLY INVASIVE MITRAL VALVE REPAIR (MVR);  Surgeon: Purcell Nailslarence H Owen, MD;  [facility];  Service: Open Heart Surgery;  Laterality: Right;  . Maze  08/25/2011    Procedure: MAZE;  Surgeon: [name], MD;  [facility];  Service: Open Heart Surgery;  Laterality: N/A;  . Left and right heart catheterization with coronary angiogram N/A 08/21/2011    Procedure: LEFT AND RIGHT HEART CATHETERIZATION WITH CORONARY ANGIOGRAM;  Surgeon: Herby Abraham, MD;  Location: [facility] CATH LAB;  Service: Cardiovascular;  Laterality: N/A;     No Known Allergies    No family history on file.   Social History [name] reports that he quit smoking about 46 years ago. His smoking use included Cigarettes. He has a 30 pack-year smoking history. He has never used smokeless tobacco. [name] reports that he does not drink alcohol.   Review of Systems CONSTITUTIONAL:  No weight loss, fever, chills, weakness or fatigue.  HEENT: Eyes: No visual loss, blurred vision, double vision or yellow sclerae.No hearing loss, sneezing, congestion, runny nose or sore throat.  SKIN: No rash or itching.  CARDIOVASCULAR: per HPI RESPIRATORY: No shortness of breath, cough or sputum.  GASTROINTESTINAL: No anorexia, nausea, vomiting or diarrhea. No abdominal pain or blood.  GENITOURINARY: No burning on urination, no polyuria NEUROLOGICAL: No headache, dizziness, syncope, paralysis, ataxia, numbness or tingling in the extremities. No change in bowel or bladder control.  MUSCULOSKELETAL: No muscle, back pain, joint pain or stiffness.  LYMPHATICS: No enlarged nodes. No history of splenectomy.  PSYCHIATRIC: No history of depression or anxiety.  ENDOCRINOLOGIC: No reports of sweating, cold or heat intolerance. No polyuria or polydipsia.  [name]   Physical Examination p 67 bp 134/73 Wt 186 lbs BMI 26 Gen: resting comfortably, no acute distress HEENT: no scleral icterus, pupils equal round and reactive, no palptable cervical adenopathy,  CV: RRR, no m/r/g, no JVD, no carotid bruits Resp: Clear to auscultation bilaterally GI: abdomen is soft, non-tender, non-distended, normal bowel sounds, no hepatosplenomegaly MSK: extremities are warm, no edema.  Skin: warm, no rash Neuro:  no focal deficits Psych: appropriate affect   Diagnostic Studies Jan 2014 Echo: LVIDd 70mm, LVEF <20%, multiple WMA, diastolic dysfunction, mild LAE, mild mitral MR, mild mitral stenosis, mild TR, normal AV function     Assessment and Plan  1. ICM: NYHA II symptoms, though somewhat difficult to evaluate due to patients overall sedentary lifestyle. He is euvolemic today.  - have not further titrated beta blocker due to low normal HR and 1st degree block on EKG.  - limited titration of ARB due to renal insufficiency, will continue current dose. Avoiding spironolactone at this time.  - I have discussed  ICD with patient, he states that currently he would not be in favor of it.  - continue current meds  2. AV replacement:  - no current symptoms, continue to follow clinically.   3. MV repair:  - no current symptoms, continue to follow clinically.   4. Afib: - no current symptoms. Reports heavy bruising, we will stop his ASA and continue coumadin  5. HTN:  - at goal, continue current meds   6. HL:  - at goal, continue current statin - repeat lipid panel.     F/u 6 months   Antoine Poche, M.D.

## 2015-01-09 DEATH — deceased

## 2015-05-18 ENCOUNTER — Ambulatory Visit: Payer: Self-pay | Admitting: *Deleted

## 2015-05-18 DIAGNOSIS — Z5181 Encounter for therapeutic drug level monitoring: Secondary | ICD-10-CM

## 2015-05-18 DIAGNOSIS — Z9889 Other specified postprocedural states: Secondary | ICD-10-CM

## 2015-05-18 DIAGNOSIS — Z953 Presence of xenogenic heart valve: Secondary | ICD-10-CM
